# Patient Record
Sex: Female | Born: 1950 | Race: White | Hispanic: No | Marital: Married | Smoking: Never smoker
Health system: Southern US, Community
[De-identification: ages and names within clinical notes are randomized; demographics above are authoritative.]

## PROBLEM LIST (undated history)

## (undated) DIAGNOSIS — R011 Cardiac murmur, unspecified: Secondary | ICD-10-CM

## (undated) DIAGNOSIS — K219 Gastro-esophageal reflux disease without esophagitis: Secondary | ICD-10-CM

## (undated) DIAGNOSIS — I10 Essential (primary) hypertension: Secondary | ICD-10-CM

## (undated) DIAGNOSIS — F419 Anxiety disorder, unspecified: Secondary | ICD-10-CM

## (undated) HISTORY — PX: COLONOSCOPY: SHX174

## (undated) HISTORY — PX: ABDOMINAL HYSTERECTOMY: SHX81

---

## 2013-02-18 ENCOUNTER — Ambulatory Visit: Payer: Self-pay | Admitting: Internal Medicine

## 2013-02-26 ENCOUNTER — Ambulatory Visit: Payer: Self-pay | Admitting: Internal Medicine

## 2015-04-28 IMAGING — US ABDOMEN ULTRASOUND LIMITED
1 series · 14 of 25 positions shown · non-contrast
Comparison: none

REASON FOR EXAM: RUQ     RUQ pain
COMMENTS:

PROCEDURE:     US  - US ABDOMEN LIMITED SURVEY  - February 26, 2013 [DATE]
RESULT:     Comparison: None
TECHNIQUE: Multiple gray-scale and color-flow Doppler images of the right
upper quadrant are presented for review.

[Series 1: abdomen ultrasound limited · 0.20mm/px · 14 of 79 slices shown]
[im 1/79]
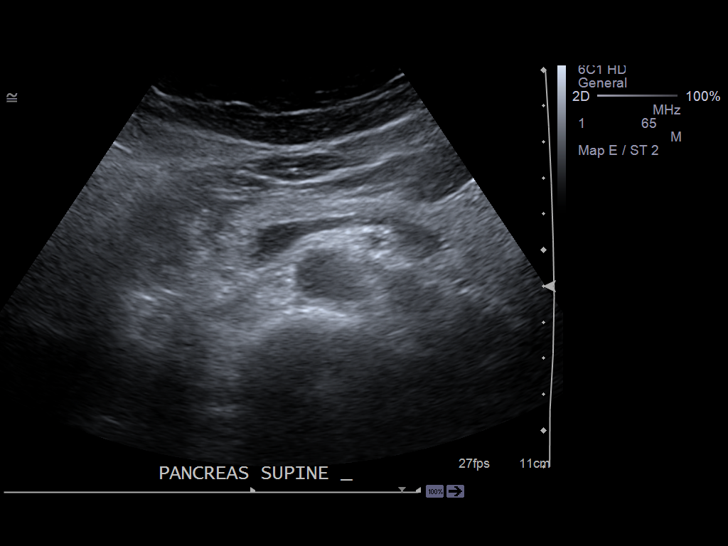
[im 7/79]
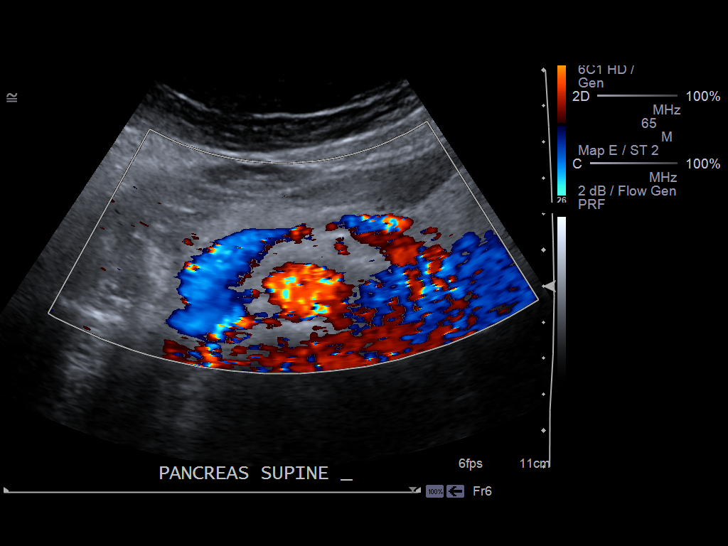
[im 14/79]
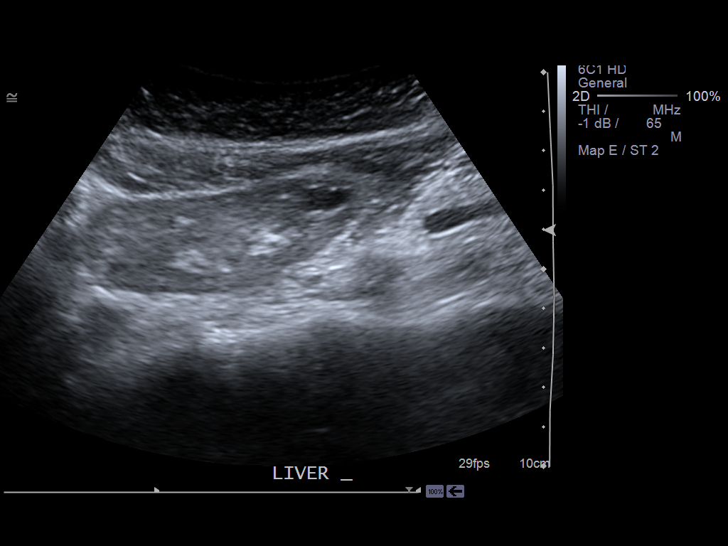
[im 20/79]
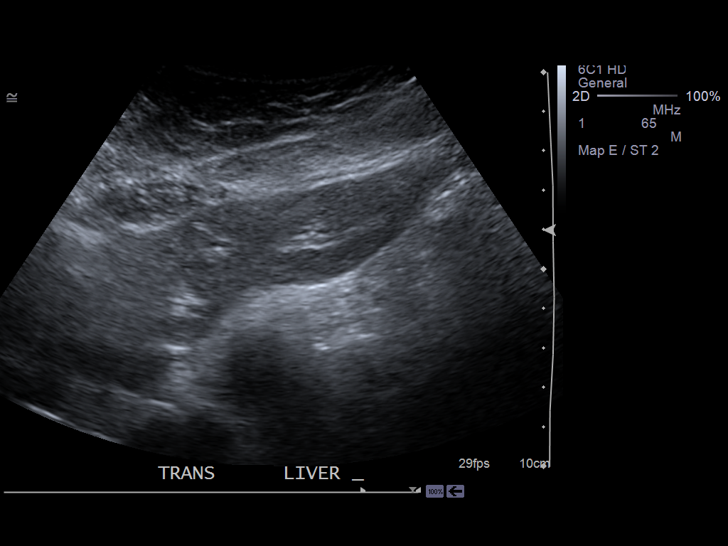
[im 27/79]
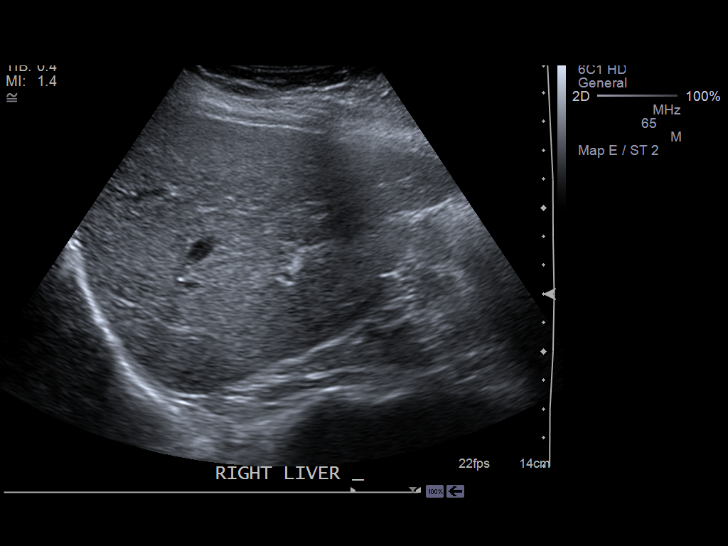
[im 30/79]
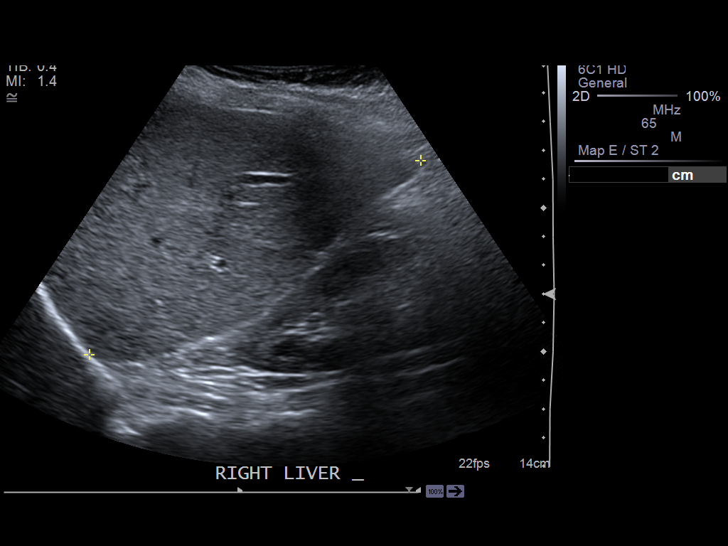
[im 36/79]
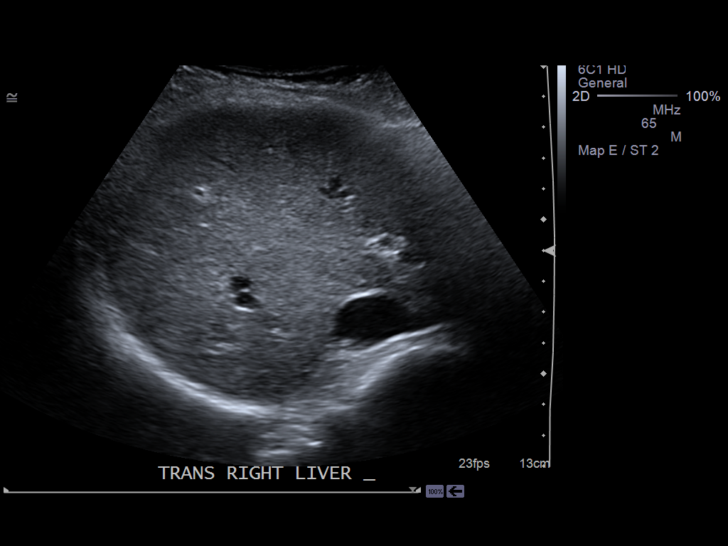
[im 43/79]
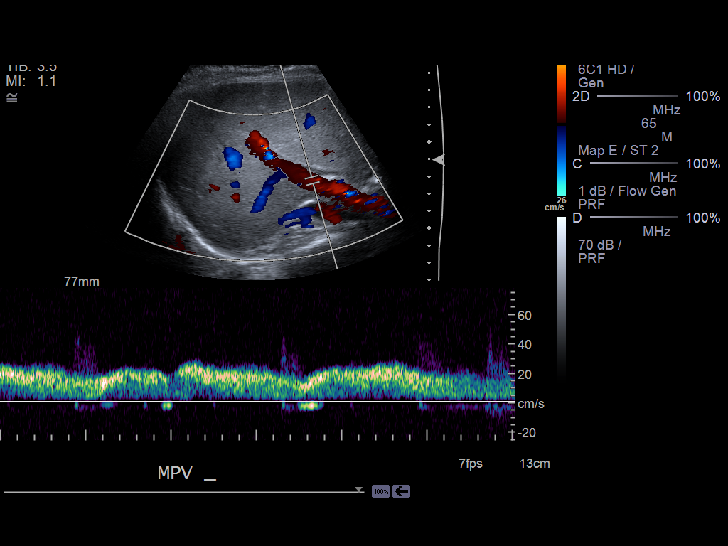
[im 49/79]
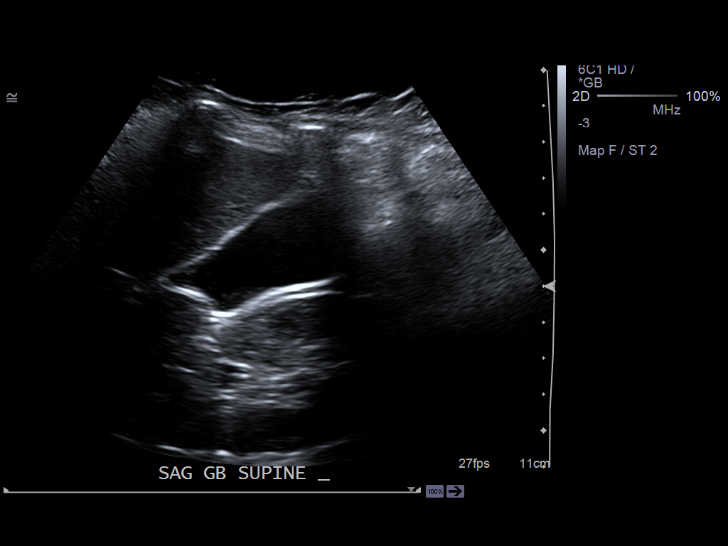
[im 53/79]
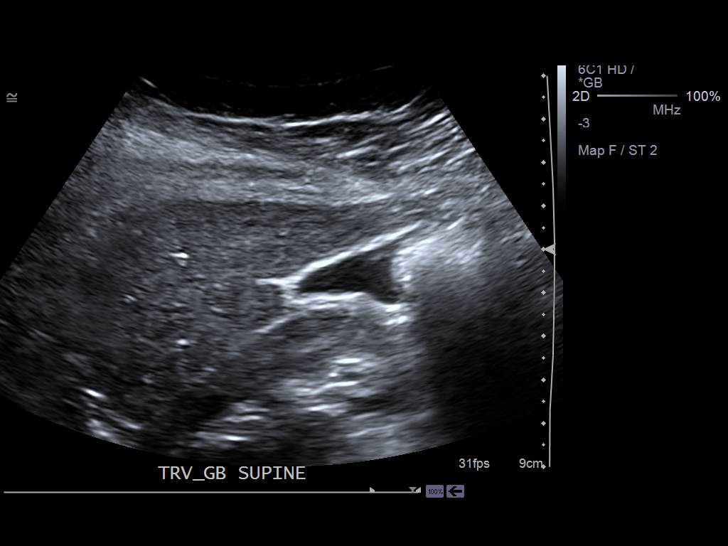
[im 59/79]
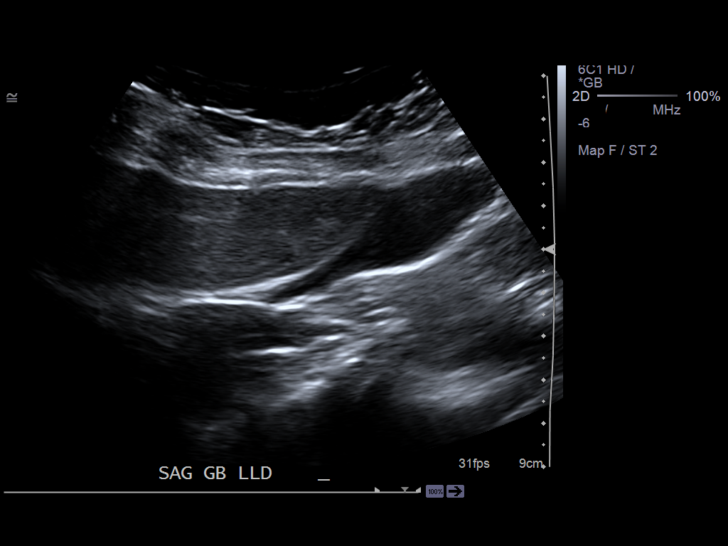
[im 66/79]
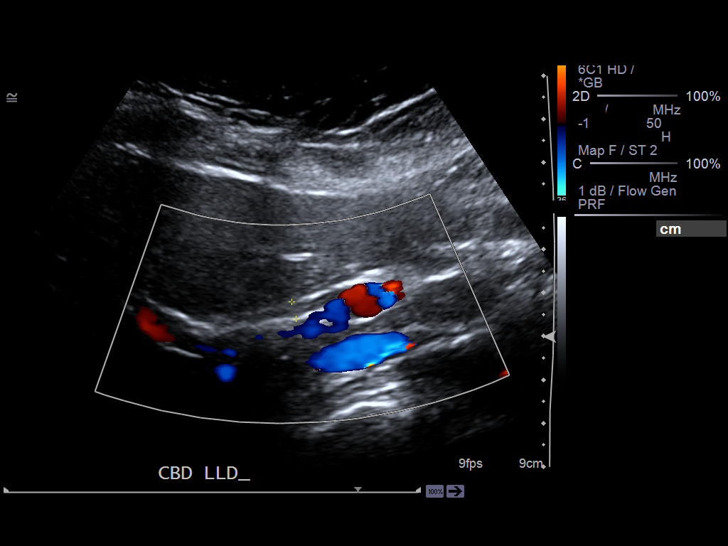
[im 72/79]
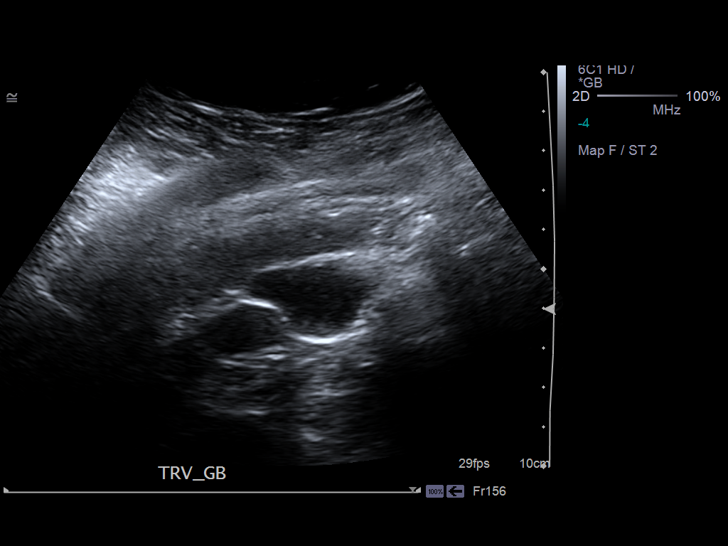
[im 79/79]
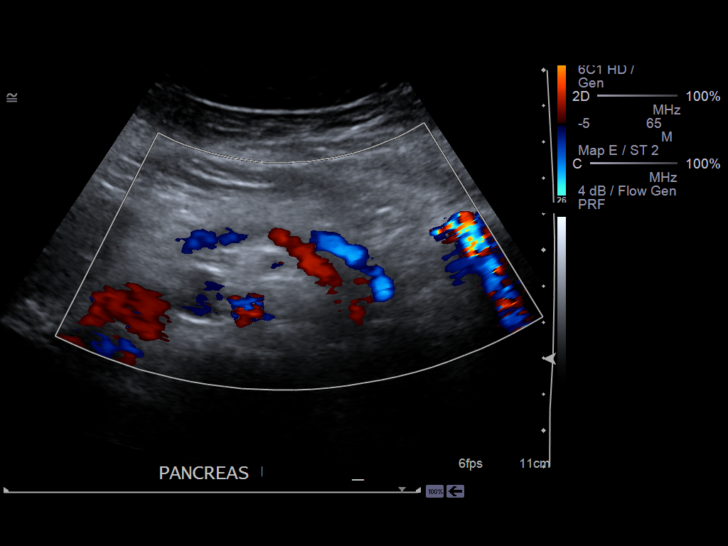

[14 of 25 positions shown; findings below may reference images not displayed]

FINDINGS: Visualized portions of the liver demonstrate normal echogenicity and normal
contours.There is a small hypoechoic area in the left lobe of the liver
measuring 1.2 x 0.8 x 0.5 cm.

There is no cholelithiasis or biliary sludge. There is no intra- or
extrahepatic biliary ductal dilatation. The common duct measures 4.5 mm in
maximal diameter. There is no gallbladder wall thickening, pericholecystic
fluid, or sonographic Murphy's sign.

The base is limited in evaluation secondary to overlying bowel gas.
IMPRESSION: No cholelithiasis or sonographic evidence of acute cholecystitis.

Indeterminate small hypoechoic nodule in the left hepatic lobe. Recommend
further evaluation with a multiphasic MRI of the abdomen.

[REDACTED]

## 2015-08-10 DIAGNOSIS — N390 Urinary tract infection, site not specified: Secondary | ICD-10-CM | POA: Diagnosis not present

## 2015-08-10 DIAGNOSIS — R399 Unspecified symptoms and signs involving the genitourinary system: Secondary | ICD-10-CM | POA: Diagnosis not present

## 2015-09-03 DIAGNOSIS — N814 Uterovaginal prolapse, unspecified: Secondary | ICD-10-CM | POA: Diagnosis not present

## 2015-09-03 DIAGNOSIS — Z1239 Encounter for other screening for malignant neoplasm of breast: Secondary | ICD-10-CM | POA: Diagnosis not present

## 2015-09-07 NOTE — H&P (Signed)
GYN H&P  CC: uterine prolapse and bladder discomfort   Subjective:    Barbara Dunlap is a 65 y.o. female who presents for a new patient office visit. She was previously seen by Dr Davis Gourd. She has known uterine prolapse, but it was not bothering her until recently. For the past 6-7 months, she has noticed more discomfort and more of a bulge in her vagina. She has not been able to have sex because of the discomfort. She has also noticed more bladder discomfort, episodes where she gets the urge to go to the bathroom but cannot go and then urges where she leaks prior to making it to the bathroom. This occurs 1x every other week. No SUI. No fecal incontinence.   She was recently treated for a UTI which has helped the bladder discomfort some, but not the urges.   She wants to pursue surgical intervention for her prolapse.   She also endorses one day in October of vaginal spotting after sitting for a long period of time. Nothing since then.  No n/v/fevers/chills/pelvic pain/bloating/early satiety  +constipation, has bowel movement 2-3x/week. Does not take supplements. +strain, no splinting, has small hemorrhoid noted on last colonoscopy.   Gynecologic History No LMP recorded. Patient is postmenopausal. Sexually active: yes but not recently due to the discomfort  Last Pap: per records 2001, but patient believes it was within the last 5 years.  Results were: normal - no h/o abnormal paps Last mammogram: 2001. Results were: normal - BIRADS 2; patient also believes she has had a mammogram more recently which was normal   Obstetric History                      OB History  Gravida Para Term Preterm AB SAB TAB Ectopic Multiple Living  2 2 2       2     # Outcome Date GA Lbr Len/2nd Weight Sex Delivery Anes PTL Lv  2 Term           1 Term               Past Medical History:  has a past medical history of Anxiety, unspecified. Problem List:  does not  have a problem list on file. Past Surgical History:  has no past surgical history on file. Family History: family history includes Multiple myeloma in her mother. Social History:  reports that she has never smoked. She has never used smokeless tobacco. She reports that she does not drink alcohol or use illicit drugs. Current Medications: has a current medication list which includes the following prescription(s): miscellaneous medical supply, multivitamin, and omega-3 fatty acids/fish oil. Prior to encounter Medications:        Current Outpatient Prescriptions on File Prior to Visit  Medication Sig Dispense Refill  . miscellaneous medical supply Misc Use as directed in the mouth or throat. Ring Stop    . multivitamin tablet Take 1 tablet by mouth once daily.    Marland Kitchen omega-3 fatty acids/fish oil 340-1,000 mg capsule Take 1 capsule by mouth 2 (two) times daily.     No current facility-administered medications on file prior to visit.    Allergies: is allergic to penicillin.  Review of Systems 14 systems reviewed pertinent positives and negatives as noted in the HPI and below.   Objective:       Vitals:   09/03/15 0946  BP: 147/86  Pulse: 97   General appearance: alert, appears stated age and cooperative PULM:  CTAB CV: RRR Head: Normocephalic, without obvious abnormality, atraumatic Breasts: normal appearance, no masses or tenderness, No nipple retraction or dimpling, No nipple discharge or bleeding, No axillary or supraclavicular adenopathy, Normal to palpation without dominant masses Abdomen: soft, non-tender; bowel sounds normal; no masses, no organomegaly Pelvic: cervix normal in appearance, external genitalia normal, no adnexal masses or tenderness, no bladder tenderness, no cervical motion tenderness, urethra without abnormality or discharge, uterus normal size, shape, and consistency, vagina normal without discharge and COMPLETE UTERINE PROLAPSE - GRADE 4 Baden  Walker Extremities: extremities normal, atraumatic, no cyanosis or edema Skin: Skin color, texture, turgor normal. No rashes or lesions Lymph nodes: Inguinal adenopathy: none    Assessment:    65yo G2P2 with symptomatic complete uterine prolapse, desiring definitive surgical therapy with TVH.   Plan:    1. Grade 4 Baden Walker prolapse/complete uterine prolapse - Extensive counseling done on r/b/a of TVH - as patient would like to resume sexual activity without discomfort, she is electing for surgery over pessary; risks of surgery reviewed including bleeding, pain, infection, possible bladder or bowel injury, possible need for transfusion and possible need for laparotomy - will defer endometrial biopsy as one day of spotting likely due to mucosal irritation - will defer pap as no h/o abnl pap  - Counseling done on risks of prolapse recurrence and possible SUI after correction and management strategies  - counseled on benefit of removing ovaries and tubes at time of surgery given her age - pt decided to proceed with BSO after signing initial consents, will add to consent on day of surgery - consent forms signed, all questions answered - book for TVH, BSO, cystoscopy - plan for pyridium 225m PO in preop area for visualization during cystoscopy   JJoylene Igo MD

## 2015-09-08 ENCOUNTER — Other Ambulatory Visit: Payer: Self-pay

## 2015-09-08 ENCOUNTER — Encounter: Payer: Self-pay | Admitting: *Deleted

## 2015-09-08 NOTE — Patient Instructions (Signed)
  Your procedure is scheduled on: 09-17-15 (FRIDAY) Report to [facility] SAME [facility] 2ND FLOOR To find out your arrival time please call [phone] between 1PM - 3PM on 09-16-15 (THURSDAY)  Remember: Instructions that are not followed completely may result in serious medical risk, up to and including death, or upon the discretion of your surgeon and anesthesiologist your surgery may need to be rescheduled.    _X___ 1. Do not eat food or drink liquids after midnight. No gum chewing or hard candies.     _X___ 2. No Alcohol for 24 hours before or after surgery.   ____ 3. Bring all medications with you on the day of surgery if instructed.    _X___ 4. Notify your doctor if there is any change in your medical condition     (cold, fever, infections).     Do not wear jewelry, make-up, hairpins, clips or nail polish.  Do not wear lotions, powders, or perfumes. You may wear deodorant.  Do not shave 48 hours prior to surgery. Men may shave face and neck.  Do not bring valuables to the hospital.    [facility] is not responsible for any belongings or valuables.               Contacts, dentures or bridgework may not be worn into surgery.  Leave your suitcase in the car. After surgery it may be brought to your room.  For patients admitted to the hospital, discharge time is determined by your treatment team.   Patients discharged the day of surgery will not be allowed to drive home.   Please read over the following fact sheets that you were given:     _X___ Take these medicines the morning of surgery with A SIP OF WATER:    1. OMEPRAZOLE  2. TAKE AN OMEPRAZOLE ON Thursday NIGHT BEFORE BED  3.   4.  5.  6.  ____ Fleet Enema (as directed)   ____ Use CHG Soap as directed  ____ Use inhalers on the day of surgery  ____ Stop metformin 2 days prior to surgery    ____ Take 1/2 of usual insulin dose the night before surgery and none on the morning of surgery.   ____ Stop  Coumadin/Plavix/aspirin-N/A  ____ Stop Anti-inflammatories-NO NSAIDS OR ASPIRIN PRODUCTS-TYLENOL OK TO TAKE   _X___ Stop supplements until after surgery-STOP FISH OIL 7 DAYS PRIOR TO SURGERY   ____ Bring C-Pap to the hospital.

## 2015-09-09 ENCOUNTER — Encounter
Admission: RE | Admit: 2015-09-09 | Discharge: 2015-09-09 | Disposition: A | Discharge status: Home / Self Care | Payer: PPO | Source: Ambulatory Visit | Attending: Obstetrics and Gynecology | Admitting: Obstetrics and Gynecology

## 2015-09-09 DIAGNOSIS — Z01812 Encounter for preprocedural laboratory examination: Secondary | ICD-10-CM | POA: Diagnosis not present

## 2015-09-09 DIAGNOSIS — Z01810 Encounter for preprocedural cardiovascular examination: Secondary | ICD-10-CM | POA: Insufficient documentation

## 2015-09-09 LAB — CBC
HEMATOCRIT: 36.4 % (ref 35.0–47.0)
HEMOGLOBIN: 12.3 g/dL (ref 12.0–16.0)
MCH: 30.9 pg (ref 26.0–34.0)
MCHC: 33.8 g/dL (ref 32.0–36.0)
MCV: 91.6 fL (ref 80.0–100.0)
Platelets: 218 10*3/uL (ref 150–440)
RBC: 3.97 MIL/uL (ref 3.80–5.20)
RDW: 13.6 % (ref 11.5–14.5)
WBC: 4.9 10*3/uL (ref 3.6–11.0)

## 2015-09-09 LAB — TYPE AND SCREEN
ABO/RH(D): O NEG
Antibody Screen: NEGATIVE

## 2015-09-09 LAB — COMPREHENSIVE METABOLIC PANEL
ALBUMIN: 4.3 g/dL (ref 3.5–5.0)
ALK PHOS: 72 U/L (ref 38–126)
ALT: 23 U/L (ref 14–54)
AST: 29 U/L (ref 15–41)
Anion gap: 7 (ref 5–15)
BILIRUBIN TOTAL: 0.7 mg/dL (ref 0.3–1.2)
BUN: 15 mg/dL (ref 6–20)
CO2: 26 mmol/L (ref 22–32)
Calcium: 9.4 mg/dL (ref 8.9–10.3)
Chloride: 99 mmol/L — ABNORMAL LOW (ref 101–111)
Creatinine, Ser: 0.91 mg/dL (ref 0.44–1.00)
GFR calc Af Amer: >60 mL/min (ref >60)
GFR calc non Af Amer: >60 mL/min (ref >60)
GLUCOSE: 101 mg/dL — AB (ref 65–99)
POTASSIUM: 3.5 mmol/L (ref 3.5–5.1)
Sodium: 132 mmol/L — ABNORMAL LOW (ref 135–145)
TOTAL PROTEIN: 7.9 g/dL (ref 6.5–8.1)

## 2015-09-09 LAB — ABO/RH: ABO/RH(D): O NEG

## 2015-09-17 ENCOUNTER — Ambulatory Visit: Payer: PPO | Admitting: Anesthesiology

## 2015-09-17 ENCOUNTER — Encounter
Admission: RE | Disposition: A | Discharge status: Home / Self Care | Payer: Self-pay | Source: Ambulatory Visit | Attending: Obstetrics and Gynecology

## 2015-09-17 ENCOUNTER — Observation Stay
Admission: RE | Admit: 2015-09-17 | Discharge: 2015-09-18 | Disposition: A | Discharge status: Home / Self Care | Payer: PPO | Source: Ambulatory Visit | Attending: Obstetrics and Gynecology | Admitting: Obstetrics and Gynecology

## 2015-09-17 ENCOUNTER — Encounter: Payer: Self-pay | Admitting: *Deleted

## 2015-09-17 DIAGNOSIS — Z807 Family history of other malignant neoplasms of lymphoid, hematopoietic and related tissues: Secondary | ICD-10-CM | POA: Diagnosis not present

## 2015-09-17 DIAGNOSIS — K219 Gastro-esophageal reflux disease without esophagitis: Secondary | ICD-10-CM | POA: Diagnosis not present

## 2015-09-17 DIAGNOSIS — Z87440 Personal history of urinary (tract) infections: Secondary | ICD-10-CM | POA: Insufficient documentation

## 2015-09-17 DIAGNOSIS — Z90710 Acquired absence of both cervix and uterus: Secondary | ICD-10-CM | POA: Diagnosis present

## 2015-09-17 DIAGNOSIS — N888 Other specified noninflammatory disorders of cervix uteri: Secondary | ICD-10-CM | POA: Diagnosis not present

## 2015-09-17 DIAGNOSIS — N8189 Other female genital prolapse: Secondary | ICD-10-CM | POA: Diagnosis not present

## 2015-09-17 DIAGNOSIS — N814 Uterovaginal prolapse, unspecified: Secondary | ICD-10-CM | POA: Diagnosis not present

## 2015-09-17 DIAGNOSIS — N736 Female pelvic peritoneal adhesions (postinfective): Secondary | ICD-10-CM | POA: Diagnosis not present

## 2015-09-17 DIAGNOSIS — Z880 Allergy status to penicillin: Secondary | ICD-10-CM | POA: Diagnosis not present

## 2015-09-17 DIAGNOSIS — N813 Complete uterovaginal prolapse: Secondary | ICD-10-CM | POA: Diagnosis not present

## 2015-09-17 HISTORY — DX: Cardiac murmur, unspecified: R01.1

## 2015-09-17 HISTORY — PX: CYSTOSCOPY: SHX5120

## 2015-09-17 HISTORY — PX: VAGINAL HYSTERECTOMY: SHX2639

## 2015-09-17 HISTORY — DX: Anxiety disorder, unspecified: F41.9

## 2015-09-17 HISTORY — DX: Gastro-esophageal reflux disease without esophagitis: K21.9

## 2015-09-17 HISTORY — PX: ANTERIOR AND POSTERIOR REPAIR: SHX5121

## 2015-09-17 SURGERY — HYSTERECTOMY, VAGINAL
Anesthesia: General | Site: Vagina

## 2015-09-17 MED ORDER — PHENAZOPYRIDINE HCL 200 MG PO TABS
200.0000 mg | ORAL_TABLET | Freq: Once | ORAL | Status: AC
  Administered 2015-09-17: 200 mg via ORAL
  Filled 2015-09-17: qty 1

## 2015-09-17 MED ORDER — ACETAMINOPHEN 650 MG RE SUPP: 650.0000 mg | Freq: Four times a day (QID) | RECTAL | Status: DC | PRN

## 2015-09-17 MED ORDER — VASOPRESSIN 20 UNIT/ML IV SOLN
INTRAVENOUS | Status: DC | PRN
  Administered 2015-09-17: 7 mL via INTRAMUSCULAR

## 2015-09-17 MED ORDER — LIDOCAINE-EPINEPHRINE 1 %-1:100000 IJ SOLN
INTRAMUSCULAR | Status: AC
  Filled 2015-09-17: qty 1

## 2015-09-17 MED ORDER — FENTANYL CITRATE (PF) 100 MCG/2ML IJ SOLN
INTRAMUSCULAR | Status: AC
  Administered 2015-09-17: 50 ug via INTRAVENOUS
  Filled 2015-09-17: qty 2

## 2015-09-17 MED ORDER — OXYCODONE HCL 5 MG PO TABS
5.0000 mg | ORAL_TABLET | ORAL | Status: DC | PRN
  Administered 2015-09-17 – 2015-09-18 (×3): 5 mg via ORAL
  Filled 2015-09-17 (×2): qty 1
  Filled 2015-09-17: qty 2

## 2015-09-17 MED ORDER — ONDANSETRON 4 MG PO TBDP
4.0000 mg | ORAL_TABLET | Freq: Four times a day (QID) | ORAL | Status: DC | PRN
  Filled 2015-09-17: qty 1

## 2015-09-17 MED ORDER — EPHEDRINE SULFATE 50 MG/ML IJ SOLN
INTRAMUSCULAR | Status: DC | PRN
  Administered 2015-09-17: 10 mg via INTRAVENOUS
  Administered 2015-09-17: 5 mg via INTRAVENOUS

## 2015-09-17 MED ORDER — BACITRACIN ZINC 500 UNIT/GM EX OINT
TOPICAL_OINTMENT | CUTANEOUS | Status: AC
  Filled 2015-09-17: qty 56.7

## 2015-09-17 MED ORDER — DEXTROSE-NACL 5-0.45 % IV SOLN
INTRAVENOUS | Status: DC
  Administered 2015-09-17 – 2015-09-18 (×3): via INTRAVENOUS

## 2015-09-17 MED ORDER — SENNOSIDES-DOCUSATE SODIUM 8.6-50 MG PO TABS
1.0000 | ORAL_TABLET | Freq: Every evening | ORAL | Status: DC | PRN
  Administered 2015-09-17: 1 via ORAL
  Filled 2015-09-17: qty 1

## 2015-09-17 MED ORDER — DEXAMETHASONE SODIUM PHOSPHATE 10 MG/ML IJ SOLN
INTRAMUSCULAR | Status: DC | PRN
  Administered 2015-09-17: 10 mg via INTRAVENOUS

## 2015-09-17 MED ORDER — FENTANYL CITRATE (PF) 100 MCG/2ML IJ SOLN
INTRAMUSCULAR | Status: DC | PRN
  Administered 2015-09-17: 50 ug via INTRAVENOUS
  Administered 2015-09-17: 150 ug via INTRAVENOUS
  Administered 2015-09-17: 50 ug via INTRAVENOUS

## 2015-09-17 MED ORDER — DIPHENHYDRAMINE HCL 50 MG/ML IJ SOLN: 12.5000 mg | Freq: Four times a day (QID) | INTRAMUSCULAR | Status: DC | PRN

## 2015-09-17 MED ORDER — MIDAZOLAM HCL 2 MG/2ML IJ SOLN
INTRAMUSCULAR | Status: DC | PRN
  Administered 2015-09-17: 1 mg via INTRAVENOUS

## 2015-09-17 MED ORDER — PROPOFOL 10 MG/ML IV BOLUS
INTRAVENOUS | Status: DC | PRN
  Administered 2015-09-17: 120 mg via INTRAVENOUS

## 2015-09-17 MED ORDER — FENTANYL CITRATE (PF) 100 MCG/2ML IJ SOLN
25.0000 ug | INTRAMUSCULAR | Status: DC | PRN
  Administered 2015-09-17 (×3): 50 ug via INTRAVENOUS

## 2015-09-17 MED ORDER — DIPHENHYDRAMINE HCL 12.5 MG/5ML PO ELIX
12.5000 mg | ORAL_SOLUTION | Freq: Four times a day (QID) | ORAL | Status: DC | PRN
  Filled 2015-09-17: qty 5

## 2015-09-17 MED ORDER — BACITRACIN ZINC 500 UNIT/GM EX OINT
TOPICAL_OINTMENT | CUTANEOUS | Status: DC | PRN
  Administered 2015-09-17 (×2): 1 via TOPICAL

## 2015-09-17 MED ORDER — VASOPRESSIN 20 UNIT/ML IV SOLN
INTRAVENOUS | Status: AC
  Filled 2015-09-17: qty 1

## 2015-09-17 MED ORDER — ONDANSETRON HCL 4 MG/2ML IJ SOLN
INTRAMUSCULAR | Status: DC | PRN
  Administered 2015-09-17: 4 mg via INTRAVENOUS

## 2015-09-17 MED ORDER — ACETAMINOPHEN 325 MG PO TABS: 650.0000 mg | ORAL_TABLET | Freq: Four times a day (QID) | ORAL | Status: DC | PRN

## 2015-09-17 MED ORDER — KETOROLAC TROMETHAMINE 15 MG/ML IJ SOLN
15.0000 mg | Freq: Four times a day (QID) | INTRAMUSCULAR | Status: AC
  Administered 2015-09-17: 15 mg via INTRAVENOUS
  Filled 2015-09-17: qty 1

## 2015-09-17 MED ORDER — CLINDAMYCIN PHOSPHATE 600 MG/50ML IV SOLN
600.0000 mg | Freq: Once | INTRAVENOUS | Status: AC
  Administered 2015-09-17: 600 mg via INTRAVENOUS

## 2015-09-17 MED ORDER — ONDANSETRON HCL 4 MG/2ML IJ SOLN: 4.0000 mg | Freq: Four times a day (QID) | INTRAMUSCULAR | Status: DC | PRN

## 2015-09-17 MED ORDER — SODIUM CHLORIDE 0.9 % IJ SOLN
INTRAMUSCULAR | Status: AC
  Filled 2015-09-17: qty 100

## 2015-09-17 MED ORDER — LIDOCAINE HCL (CARDIAC) 20 MG/ML IV SOLN
INTRAVENOUS | Status: DC | PRN
  Administered 2015-09-17: 10 mg via INTRAVENOUS

## 2015-09-17 MED ORDER — KETOROLAC TROMETHAMINE 30 MG/ML IJ SOLN
INTRAMUSCULAR | Status: DC | PRN
  Administered 2015-09-17: 30 mg via INTRAVENOUS

## 2015-09-17 MED ORDER — LACTATED RINGERS IV SOLN
INTRAVENOUS | Status: DC
  Administered 2015-09-17 (×2): via INTRAVENOUS

## 2015-09-17 MED ORDER — PROMETHAZINE HCL 25 MG/ML IJ SOLN: 6.2500 mg | INTRAMUSCULAR | Status: DC | PRN

## 2015-09-17 MED ORDER — ROCURONIUM BROMIDE 100 MG/10ML IV SOLN
INTRAVENOUS | Status: DC | PRN
  Administered 2015-09-17: 30 mg via INTRAVENOUS

## 2015-09-17 MED ORDER — GENTAMICIN SULFATE 40 MG/ML IJ SOLN
1.5000 mg/kg | Freq: Once | INTRAMUSCULAR | Status: AC
  Administered 2015-09-17: 90 mg via INTRAVENOUS
  Filled 2015-09-17: qty 2.25

## 2015-09-17 MED ORDER — SUGAMMADEX SODIUM 500 MG/5ML IV SOLN
INTRAVENOUS | Status: DC | PRN
  Administered 2015-09-17: 119.8 mg via INTRAVENOUS

## 2015-09-17 MED ORDER — CLINDAMYCIN PHOSPHATE 600 MG/50ML IV SOLN
INTRAVENOUS | Status: AC
Start: 2015-09-17 — End: 2015-09-17
  Administered 2015-09-17: 600 mg via INTRAVENOUS
  Filled 2015-09-17: qty 50

## 2015-09-17 MED ORDER — KETOROLAC TROMETHAMINE 15 MG/ML IJ SOLN
15.0000 mg | Freq: Four times a day (QID) | INTRAMUSCULAR | Status: DC | PRN
  Administered 2015-09-18 (×3): 15 mg via INTRAVENOUS
  Filled 2015-09-17 (×3): qty 1

## 2015-09-17 SURGICAL SUPPLY — 42 items
BAG URO DRAIN 2000ML W/SPOUT (MISCELLANEOUS) ×4 IMPLANT
CANISTER SUCT 1200ML W/VALVE (MISCELLANEOUS) ×4 IMPLANT
CATH FOLEY 2WAY  5CC 16FR (CATHETERS) ×1
CATH ROBINSON RED A/P 16FR (CATHETERS) ×4 IMPLANT
CATH URTH 16FR FL 2W BLN LF (CATHETERS) ×3 IMPLANT
CNTNR SPEC 2.5X3XGRAD LEK (MISCELLANEOUS) ×3
CONT SPEC 4OZ STER OR WHT (MISCELLANEOUS) ×1
CONTAINER SPEC 2.5X3XGRAD LEK (MISCELLANEOUS) ×3 IMPLANT
DRAPE PERI LITHO V/GYN (MISCELLANEOUS) ×4 IMPLANT
DRAPE SURG 17X11 SM STRL (DRAPES) ×4 IMPLANT
DRAPE UNDER BUTTOCK W/FLU (DRAPES) ×4 IMPLANT
ELECT REM PT RETURN 9FT ADLT (ELECTROSURGICAL) ×4
ELECTRODE REM PT RTRN 9FT ADLT (ELECTROSURGICAL) ×3 IMPLANT
GAUZE PACK 2X3YD (MISCELLANEOUS) ×4 IMPLANT
GLOVE BIO SURGEON STRL SZ 6.5 (GLOVE) ×4 IMPLANT
GLOVE BIO SURGEON STRL SZ7 (GLOVE) ×4 IMPLANT
GLOVE BIO SURGEON STRL SZ8 (GLOVE) ×8 IMPLANT
GLOVE BIOGEL PI IND STRL 6.5 (GLOVE) ×3 IMPLANT
GLOVE BIOGEL PI IND STRL 7.0 (GLOVE) ×3 IMPLANT
GLOVE BIOGEL PI INDICATOR 6.5 (GLOVE) ×1
GLOVE BIOGEL PI INDICATOR 7.0 (GLOVE) ×1
GOWN STRL REUS W/ TWL LRG LVL3 (GOWN DISPOSABLE) ×9 IMPLANT
GOWN STRL REUS W/ TWL XL LVL3 (GOWN DISPOSABLE) ×3 IMPLANT
GOWN STRL REUS W/TWL LRG LVL3 (GOWN DISPOSABLE) ×3
GOWN STRL REUS W/TWL XL LVL3 (GOWN DISPOSABLE) ×1
KIT RM TURNOVER CYSTO AR (KITS) ×4 IMPLANT
LABEL OR SOLS (LABEL) ×4 IMPLANT
NDL SAFETY 22GX1.5 (NEEDLE) ×4 IMPLANT
NEEDLE HYPO 22GX1.5 SAFETY (NEEDLE) ×4 IMPLANT
PACK BASIN MINOR ARMC (MISCELLANEOUS) ×4 IMPLANT
PAD OB MATERNITY 4.3X12.25 (PERSONAL CARE ITEMS) ×4 IMPLANT
PAD PREP 24X41 OB/GYN DISP (PERSONAL CARE ITEMS) ×4 IMPLANT
SET CYSTO W/LG BORE CLAMP LF (SET/KITS/TRAYS/PACK) ×4 IMPLANT
SUT PDS 2-0 27IN (SUTURE) ×4 IMPLANT
SUT VIC AB 0 CT1 27 (SUTURE) ×4
SUT VIC AB 0 CT1 27XCR 8 STRN (SUTURE) ×12 IMPLANT
SUT VIC AB 0 CT1 36 (SUTURE) ×8 IMPLANT
SUT VIC AB 2-0 SH 27 (SUTURE) ×3
SUT VIC AB 2-0 SH 27XBRD (SUTURE) ×9 IMPLANT
SYR CONTROL 10ML (SYRINGE) ×4 IMPLANT
SYRINGE 10CC LL (SYRINGE) ×8 IMPLANT
WATER STERILE IRR 1000ML POUR (IV SOLUTION) ×4 IMPLANT

## 2015-09-17 NOTE — Anesthesia Postprocedure Evaluation (Signed)
Anesthesia Post Note  Patient: Barbara Dunlap  Procedure(s) Performed: Procedure(s) (LRB): HYSTERECTOMY VAGINAL (N/A) ANTERIOR REPAIR (CYSTOCELE)  (N/A) CYSTOSCOPY (N/A)  Patient location during evaluation: PACU Anesthesia Type: General Level of consciousness: awake and alert Pain management: pain level controlled Vital Signs Assessment: post-procedure vital signs reviewed and stable Respiratory status: spontaneous breathing, nonlabored ventilation, respiratory function stable and patient connected to nasal cannula oxygen Cardiovascular status: blood pressure returned to baseline and stable Postop Assessment: no signs of nausea or vomiting Anesthetic complications: no    Last Vitals:  Filed Vitals:   09/17/15 1451 09/17/15 1517  BP: 121/72 120/63  Pulse: 73 78  Temp: 36.5 C   Resp: 14     Last Pain:  Filed Vitals:   09/17/15 1517  PainSc: 4                  Lenard Simmer

## 2015-09-17 NOTE — Anesthesia Procedure Notes (Signed)
Procedure Name: Intubation Date/Time: 09/17/2015 11:18 AM Performed by: Omer Jack Pre-anesthesia Checklist: Patient identified, Patient being monitored, Timeout performed, Emergency Drugs available and Suction available Patient Re-evaluated:Patient Re-evaluated prior to inductionOxygen Delivery Method: Circle system utilized Preoxygenation: Pre-oxygenation with 100% oxygen Intubation Type: IV induction Ventilation: Mask ventilation without difficulty Laryngoscope Size: Miller and 2 Grade View: Grade I Tube type: Oral Tube size: 7.0 mm Number of attempts: 1 Placement Confirmation: ETT inserted through vocal cords under direct vision,  positive ETCO2 and breath sounds checked- equal and bilateral Secured at: 21 cm Tube secured with: Tape Dental Injury: Teeth and Oropharynx as per pre-operative assessment

## 2015-09-17 NOTE — OR Nursing (Signed)
Variance from original schedule. Dr. Tildon Husky has requessted consent for TVH, BSO, cystoscopy and possible anterior repair. This was discussed with patient and she is in full agreement.

## 2015-09-17 NOTE — Op Note (Signed)
Barbara Dunlap PROCEDURE DATE: 09/17/2015  PREOPERATIVE DIAGNOSIS:   Pelvic organ prolapse   POSTOPERATIVE DIAGNOSIS:   Pelvic organ prolapse  SURGEON:   Ala Dach, M.D. ASSISTANT: Suzy Bouchard, M.D. OPERATION:  Total Vaginal Hysterectomy, apical suspension, anterior repair, cystoscopy  ANESTHESIA:  General endotracheal.  INDICATIONS: The patient is a 65 y.o. G2P2 w/ symptomatic POP desiring surgical intervention. On the preoperative visit, the risks, benefits, indications, and alternatives of the procedure were reviewed with the patient.  On the day of surgery, the risks of surgery were again discussed with the patient including but not limited to: bleeding which may require transfusion or reoperation; infection which may require antibiotics; injury to bowel, bladder, ureters or other surrounding organs; need for additional procedures; thromboembolic phenomenon, incisional problems and other postoperative/anesthesia complications. Written informed consent was obtained.    OPERATIVE FINDINGS: A 5cm size uterus with normal tubes and ovaries bilaterally. Complete uterine prolapse. Redundant bladder tissue on cystoscopy.    ESTIMATED BLOOD LOSS: 50 ml FLUIDS:  800 ml of Lactated Ringers URINE OUTPUT:  700 ml of clear yellow urine. SPECIMENS:  Uterus and cervix sent to pathology COMPLICATIONS:  None immediate.  DESCRIPTION OF PROCEDURE:  The patient received prophylactic intravenous antibiotics with gentamycin and clindamycin, received  PO Pyridium, and had sequential compression devices applied to her lower extremities while in the preoperative area.    She was taken to the operating room, where she was identified by name and birth date. General anesthesia was administered and was found to be adequate.  She was placed in the dorsal lithotomy position using yellow fins, and was prepped and draped in a sterile manner.  A formal time out procedure was performed with all team  members present and in agreement. A straight catheter was inserted into her bladder and drained for 550cc urine. A urine specimen was sent for urine culture for a TOC after treatment for UTI. Attention was turned to her pelvis.   A weighted speculum was placed in the vagina, and the anterior and posterior lips of the cervix were grasped bilaterally with thyroid tenaculums. The cervix was circumferentially incised using #10 blade. The vesicouterine peritoneum was not readily identified anterior or posterior. Progressive bites were taken of paracervical tissue with the Heaney clamp and then cut and suture ligated until the vesicouterine peritoneum could be identified. The vesicouterine peritoneum was identified in  posterior cul-de-sac was entered sharply in the midline. A long weighted speculum was inserted into the posterior cul-de-sac. The anterior cul-de-sac was then entered sharply without difficulty and the bladder retracted out of the operative field behind a retractor. Neither the uterosacral or the cardinal ligaments could be readily identified during the dissection. The uterine vessels and broad ligaments were then serially clamped with the Heaney clamps, cut, and suture ligated on both sides.  Excellent hemostasis was noted.    The uterus was then delivered via the posterior cul-de-sac, and the cornua were clamped with the Heaney clamps, transected, and the uterine specimen was delivered and sent to pathology. These pedicles were then suture ligated to ensure hemostasis. Hemostasis was achieved with figure of eight sutures of 0-Vicryl.   The Fallopian tubes were then identified high in the pelvis and not readily visualzied. The ovaries were palpated and found to be normal.   Once hemostasis was achieved, a 1-Vicryl was used for the culdoplasty. The uterosacral ligaments were not able to be identified likely due to long standing prolapse. A high apical stitch was placed  in the posterior peritoneum  with good reapproximation of the posterior apical structures and decent support of the vaginal wall. Due to her pelvic anatomy, better support was unable to be achieved without pursuing a sacrospinous ligament suspension, for which she was not consented. The peritoneum of the cuff was then purse stringed with 1-Vicryl from left to right.   Attention was then focused on the anterior repair. An Allis clamp was placed under the urethra and a second Allis was placed at the superior aspect of the vaginal cuff repair to serve as the apex. 5cc of dilute vasopressin was injected into the vaginal mucosa hydrodissecting the plane. A #15 blade was used to make a midline vaginal wall incision. Bilateral flaps of the perivaginal fascia and perivesical fascia were created. The bladder was then plicated in a left to right fashion using 0-Vicryl interrupted sutures. The vaginal wall was trimmed and then sutured with 2-0 Vicryl in a continuous manner. This also incorporated the vaginal mucosa of the cuff.    Cystoscopy was then performed with a 70 degree scope which confirmed bilateral ureteral jets and intact bladder. The bladder appeared to have redundant tissue.   A pack was placed after a Foley catheter was inserted for postoperative care.  The patient tolerated the procedure well.  All instruments, needles, and sponge counts were correct x 2. The patient was taken to the recovery room in stable condition.    Ala Dach, MD

## 2015-09-17 NOTE — Anesthesia Preprocedure Evaluation (Signed)
Anesthesia Evaluation  Patient identified by MRN, date of birth, ID band Patient awake    Reviewed: Allergy & Precautions, H&P , NPO status , Patient's Chart, lab work & pertinent test results, reviewed documented beta blocker date and time   History of Anesthesia Complications Negative for: history of anesthetic complications  Airway Mallampati: I  TM Distance: >3 FB Neck ROM: full    Dental no notable dental hx. (+) Caps, Chipped, Missing   Pulmonary neg pulmonary ROS,    Pulmonary exam normal breath sounds clear to auscultation       Cardiovascular Exercise Tolerance: Good (-) hypertension(-) angina(-) CAD, (-) Past MI, (-) Cardiac Stents and (-) CABG Normal cardiovascular exam(-) dysrhythmias + Valvular Problems/Murmurs (had an echo that was not worrisome)  Rhythm:regular Rate:Normal     Neuro/Psych PSYCHIATRIC DISORDERS (Anxiety) negative neurological ROS     GI/Hepatic Neg liver ROS, GERD  Medicated and Controlled,  Endo/Other  negative endocrine ROS  Renal/GU negative Renal ROS  negative genitourinary   Musculoskeletal   Abdominal   Peds  Hematology negative hematology ROS (+)   Anesthesia Other Findings Past Medical History:   Heart murmur                                                 Anxiety                                                      GERD (gastroesophageal reflux disease)                       Reproductive/Obstetrics negative OB ROS                             Anesthesia Physical Anesthesia Plan  ASA: II  Anesthesia Plan: General   Post-op Pain Management:    Induction:   Airway Management Planned:   Additional Equipment:   Intra-op Plan:   Post-operative Plan:   Informed Consent: I have reviewed the patients History and Physical, chart, labs and discussed the procedure including the risks, benefits and alternatives for the proposed anesthesia with the  patient or authorized representative who has indicated his/her understanding and acceptance.   Dental Advisory Given  Plan Discussed with: Anesthesiologist, CRNA and Surgeon  Anesthesia Plan Comments:         Anesthesia Quick Evaluation

## 2015-09-17 NOTE — Transfer of Care (Signed)
Immediate Anesthesia Transfer of Care Note  Patient: Barbara Dunlap  Procedure(s) Performed: Procedure(s): HYSTERECTOMY VAGINAL (N/A) ANTERIOR REPAIR (CYSTOCELE)  (N/A) CYSTOSCOPY (N/A)  Patient Location: PACU  Anesthesia Type:General  Level of Consciousness: patient cooperative and lethargic  Airway & Oxygen Therapy: Patient Spontanous Breathing and Patient connected to face mask oxygen  Post-op Assessment: Report given to RN and Post -op Vital signs reviewed and stable  Post vital signs: Reviewed and stable  Last Vitals:  Filed Vitals:   09/17/15 0841 09/17/15 1335  BP: 149/88 97/60  Pulse: 88 67  Temp: 36 C 36.7 C  Resp: 16 15    Complications: No apparent anesthesia complications

## 2015-09-18 DIAGNOSIS — N814 Uterovaginal prolapse, unspecified: Secondary | ICD-10-CM | POA: Diagnosis not present

## 2015-09-18 MED ORDER — OXYCODONE HCL 5 MG PO TABS: 5.0000 mg | ORAL_TABLET | ORAL | Status: DC | PRN

## 2015-09-18 MED ORDER — POVIDONE-IODINE 10 % EX SWAB: 1.0000 "application " | CUTANEOUS | Status: DC | PRN

## 2015-09-18 MED ORDER — DOCUSATE SODIUM 50 MG PO CAPS: 50.0000 mg | ORAL_CAPSULE | Freq: Every day | ORAL | Status: DC

## 2015-09-18 MED ORDER — CATHETER NELATION STRAIGHT TIP MISC: Status: DC

## 2015-09-18 NOTE — Care Management Obs Status (Signed)
MEDICARE OBSERVATION STATUS NOTIFICATION   Patient Details  Name: Barbara Dunlap MRN: [id number] Date of Birth: [birth year]   Medicare Observation Status Notification Given:  No (surgical patient / discharged prior to letter delivered)    Caren Macadam, RN 09/18/2015, 5:28 PM

## 2015-09-18 NOTE — Progress Notes (Signed)
POD#1 Surgery: TVH, Anterior Repair, Cystoscopy  Overnight events: none  S: Pain well controlled with roxi, tolerating PO, no n/v, not yet OOB  O: Filed Vitals:   09/18/15 0414 09/18/15 0717  BP: 106/52 97/47  Pulse: 74 82  Temp: 98.4 F (36.9 C) 98.5 F (36.9 C)  Resp: 18 16     UOP: 750cc/10 hrs = 75cc/hr  GEN: NAD ABD: soft, NT GU: vaginal pack removed, no bright red blood, vulva normal EXT: wwp   A/P: 64yo G2P2 POD#1 s/p TVH, Anterior Repair, Cystoscopy for POP. Doing well. Extensive discussion had with patient regarding the operative findings (redundant bladder and concern for overflow incontinence, lack of apical support). We discussed the benefit of straight catheterization at home intermittently as needed given her inability to empty her bladder completely. Patient in agreement with plan  1. Pain - cont roxi  - rx for home  2. GU - monitor bleeding, precautions given - TOV today - will straight cath after 2nd void and measure - straight cath teaching - please send home with toilet hat to record UOP   Anticipate D/C home today after TOV  Ala Dach, MD

## 2015-09-18 NOTE — Discharge Instructions (Signed)
Vaginal Hysterectomy, Care After Refer to this sheet in the next few weeks. These instructions provide you with information on caring for yourself after your procedure. Your health care provider may also give you more specific instructions. Your treatment has been planned according to current medical practices, but problems sometimes occur. Call your health care provider if you have any problems or questions after your procedure. WHAT TO EXPECT AFTER THE PROCEDURE After your procedure, it is typical to have the following:  Abdominal pain. You will be given pain medicine to control it.  Sore throat from the breathing tube that was inserted during surgery. HOME CARE INSTRUCTIONS  Only take over-the-counter or prescription medicines for pain, discomfort, or fever as directed by your health care provider.  Do not take aspirin. It can cause bleeding.  Do not drive when taking pain medicine.  Follow your health care provider's advice regarding diet, exercise, lifting, driving, and general activities.  Resume your usual diet as directed and allowed.  Get plenty of rest and sleep.  Do not douche, use tampons, or have sexual intercourse for at least 6 weeks, or until your health care provider gives you permission.  Monitor your temperature and notify your health care provider of a fever.  Take showers instead of baths for 6 weeks.  Do not drink alcohol until your health care provider gives you permission.  If you develop constipation, you may take a mild laxative with your health care provider's permission. Bran foods may help with constipation problems. Drinking enough fluids to keep your urine clear or pale yellow may help as well.  Try to have someone home with you for 1-2 weeks to help around the house.  Keep all of your follow-up appointments as directed by your health care provider. SEEK MEDICAL CARE IF:  1. You have swelling, redness, or increasing pain around your incision  sites. 2. You have pus coming from your incision. 3. You notice a bad smell coming from your incision. 4. Your incision breaks open. 5. You feel dizzy or lightheaded. 6. You have pain or bleeding when you urinate. 7. You have persistent diarrhea. 8. You have persistent nausea and vomiting. 9. You have abnormal vaginal discharge. 10. You have a rash. 11. You have any type of abnormal reaction or develop an allergy to your medicine. 12. You have poor pain control with your prescribed medicine. SEEK IMMEDIATE MEDICAL CARE IF:   You have a fever.  You have severe abdominal pain.  You have chest pain.  You have shortness of breath.  You faint.  You have pain, swelling, or redness in your leg.  You have heavy vaginal bleeding with blood clots. MAKE SURE YOU:  Understand these instructions.  Will watch your condition.  Will get help right away if you are not doing well or get worse.   This information is not intended to replace advice given to you by your health care provider. Make sure you discuss any questions you have with your health care provider.   Document Released: 07/13/2011 Document Revised: 07/29/2013 Document Reviewed: 02/06/2013 Elsevier Interactive Patient Education 2016 [employer]  Bladder diary: DAILY Record when you void (date/time) and how much Record episodes of leakage or urgency

## 2015-09-18 NOTE — Progress Notes (Signed)
Discharge instructions complete and prescriptions given. Patient verbalizes understanding of teaching. Patient discharged home at 1730.

## 2015-09-18 NOTE — Discharge Summary (Signed)
Physician Discharge Summary  Patient ID: KEARSTIN LEARN MRN: [id number] DOB/AGE: [birth year] 65 y.o.  Admit date: 09/17/2015 Discharge date: 09/18/2015  Admission Diagnoses: POP, incontinence  Discharge Diagnoses:  Active Problems:   S/P vaginal hysterectomy   Discharged Condition: good  Hospital Course: Patient underwent and uncomplicated TVH and anterior repair. She did well overnight on PO pain meds. She tolerated PO and was able to ambulate without difficulty. She voided 400cc and then 600cc with 15cc urine on straight cath. She was discharged home on strict pelvic rest on POD#1.  Consults: None  Significant Diagnostic Studies: none  Discharge Exam: Blood pressure 114/57, pulse 75, temperature 98.2 F (36.8 C), temperature source Oral, resp. rate 18, height  (1.702 m), weight 59.875 kg (132 lb), SpO2 100 %. General appearance: alert, cooperative and no distress Pelvic: external genitalia normal and vagina normal without discharge Extremities: extremities normal, atraumatic, no cyanosis or edema  Disposition: Final discharge disposition not confirmed HOME    Medication List    STOP taking these medications        multivitamin tablet     sulfamethoxazole-trimethoprim 800-160 MG tablet  Commonly known as:  BACTRIM DS,SEPTRA DS      TAKE these medications        docusate sodium 50 MG capsule  Commonly known as:  COLACE  Take 1 capsule (50 mg total) by mouth daily.     FISH OIL PO  Take 1 tablet by mouth daily.     omeprazole 20 MG capsule  Commonly known as:  PRILOSEC  Take 20 mg by mouth as needed.     OVER THE COUNTER MEDICATION  Take 1 tablet by mouth as needed (RING-STOP).     oxyCODONE 5 MG immediate release tablet  Commonly known as:  Oxy IR/ROXICODONE  Take 1 tablet (5 mg total) by mouth every 4 (four) hours as needed for moderate pain or severe pain.           Follow-up Information    Follow up with Ala Dach, MD On 09/23/2015.   Specialty:  Obstetrics and Gynecology   Contact information:   [address] [place] [address] [phone]       Follow up with Ala Dach, MD On 10/29/2015.   Specialty:  Obstetrics and Gynecology   Contact information:   [address] [place] [address] [phone]       Signed: Ala Dach 09/18/2015, 4:15 PM

## 2015-09-19 LAB — URINE CULTURE: CULTURE: NO GROWTH

## 2015-09-20 LAB — SURGICAL PATHOLOGY

## 2016-07-12 DIAGNOSIS — J399 Disease of upper respiratory tract, unspecified: Secondary | ICD-10-CM | POA: Diagnosis not present

## 2016-07-12 DIAGNOSIS — I059 Rheumatic mitral valve disease, unspecified: Secondary | ICD-10-CM | POA: Diagnosis not present

## 2016-07-12 DIAGNOSIS — J00 Acute nasopharyngitis [common cold]: Secondary | ICD-10-CM | POA: Diagnosis not present

## 2016-07-12 DIAGNOSIS — I10 Essential (primary) hypertension: Secondary | ICD-10-CM | POA: Diagnosis not present

## 2016-07-17 ENCOUNTER — Other Ambulatory Visit: Payer: Self-pay | Admitting: Internal Medicine

## 2016-07-17 ENCOUNTER — Ambulatory Visit
Admission: RE | Admit: 2016-07-17 | Discharge: 2016-07-17 | Disposition: A | Discharge status: Home / Self Care | Payer: PPO | Source: Ambulatory Visit | Attending: Internal Medicine | Admitting: Internal Medicine

## 2016-07-17 DIAGNOSIS — R059 Cough, unspecified: Secondary | ICD-10-CM

## 2016-07-17 DIAGNOSIS — J209 Acute bronchitis, unspecified: Secondary | ICD-10-CM | POA: Diagnosis not present

## 2016-07-17 DIAGNOSIS — R531 Weakness: Secondary | ICD-10-CM | POA: Insufficient documentation

## 2016-07-17 DIAGNOSIS — J00 Acute nasopharyngitis [common cold]: Secondary | ICD-10-CM | POA: Diagnosis not present

## 2016-07-17 DIAGNOSIS — R05 Cough: Secondary | ICD-10-CM | POA: Diagnosis not present

## 2016-07-17 DIAGNOSIS — I059 Rheumatic mitral valve disease, unspecified: Secondary | ICD-10-CM | POA: Diagnosis not present

## 2016-07-17 DIAGNOSIS — I10 Essential (primary) hypertension: Secondary | ICD-10-CM | POA: Diagnosis not present

## 2016-07-18 DIAGNOSIS — J00 Acute nasopharyngitis [common cold]: Secondary | ICD-10-CM | POA: Diagnosis not present

## 2016-07-18 DIAGNOSIS — I059 Rheumatic mitral valve disease, unspecified: Secondary | ICD-10-CM | POA: Diagnosis not present

## 2016-07-18 DIAGNOSIS — J399 Disease of upper respiratory tract, unspecified: Secondary | ICD-10-CM | POA: Diagnosis not present

## 2016-07-18 DIAGNOSIS — I10 Essential (primary) hypertension: Secondary | ICD-10-CM | POA: Diagnosis not present

## 2017-01-18 ENCOUNTER — Ambulatory Visit (INDEPENDENT_AMBULATORY_CARE_PROVIDER_SITE_OTHER): Payer: PPO | Admitting: Obstetrics & Gynecology

## 2017-01-18 ENCOUNTER — Encounter: Payer: Self-pay | Admitting: Obstetrics & Gynecology

## 2017-01-18 VITALS — BP 149/87 | HR 97 | Ht 67.0 in | Wt 129.0 lb

## 2017-01-18 DIAGNOSIS — N8110 Cystocele, unspecified: Secondary | ICD-10-CM

## 2017-01-18 NOTE — Progress Notes (Signed)
   Subjective:    Patient ID: Barbara Dunlap, female    DOB: January 25, 1951, 66 y.o.   MRN: [id number]  HPI 66 yo MW P2 (8640 and 67 yo kids, 1 granddaughter in [place]) here today with the issue of possible vaginal prolapse. She had TVH, anterior repair, apical fixation, and cystoscopy at [facility] last year. The prolapse repeat started around the end of April. She is not sexually active for years.   Review of Systems She denies any incontinence.    Objective:   Physical Exam WNWHWFNAD Breathing, conversing, and ambulating normally Her vagina is completely prolapsed I fitted her with a #3 ring with support. This seemed to relieve her symptoms and not cause any discomfort.       Assessment & Plan:  Complete vaginal prolpase- I discussed pessary versus referral to Dr. Lavella Hammockatherine Matthews at [facility] for surgical repair. At this point she would like to try the pessary. We will order the appropriate pessary and call her when it has arrived for a pessary visit.

## 2017-01-29 ENCOUNTER — Ambulatory Visit: Payer: PPO | Admitting: Obstetrics & Gynecology

## 2017-02-08 ENCOUNTER — Encounter: Payer: Self-pay | Admitting: Obstetrics & Gynecology

## 2017-02-08 ENCOUNTER — Ambulatory Visit (INDEPENDENT_AMBULATORY_CARE_PROVIDER_SITE_OTHER): Payer: PPO | Admitting: Obstetrics & Gynecology

## 2017-02-08 VITALS — BP 146/75 | HR 91 | Wt 129.0 lb

## 2017-02-08 DIAGNOSIS — N8110 Cystocele, unspecified: Secondary | ICD-10-CM | POA: Diagnosis not present

## 2017-02-08 NOTE — Progress Notes (Signed)
   Subjective:    Patient ID: Barbara Dunlap, female    DOB: 1951/06/22, 66 y.o.   MRN: [id number]  HPI  66 yo lady here for her pessary.   Review of Systems     Objective:   Physical Exam WNWHWFNAD Breathing, conversing, and ambulating normally #3 ring with support was placed. It relieved her prolapse and caused no pain. She was able to remove and replace it.       Assessment & Plan:  Prolapse- rec removal of pessary at least weekly RTC 4 weeks/prn sooner

## 2017-02-09 ENCOUNTER — Telehealth: Payer: Self-pay | Admitting: *Deleted

## 2017-02-09 NOTE — Telephone Encounter (Signed)
Pt was seen in the office yesterday for pessary fitting, states she is now experiencing some dark brown vaginal discharge like old blood.  Informed pt that her vaginal tissue is very thin at this point and due to taking the pessary in and out it may have caused a slight abrasion to the vaginal wall.  Instructed to continue using the pessary and to call back if bleeding turns bright red and increases or she starts to have increase pain.  Pt acknowledged instructions.

## 2017-02-23 DIAGNOSIS — R5381 Other malaise: Secondary | ICD-10-CM | POA: Diagnosis not present

## 2017-02-23 DIAGNOSIS — I10 Essential (primary) hypertension: Secondary | ICD-10-CM | POA: Diagnosis not present

## 2017-02-23 DIAGNOSIS — E784 Other hyperlipidemia: Secondary | ICD-10-CM | POA: Diagnosis not present

## 2017-03-23 ENCOUNTER — Ambulatory Visit: Payer: PPO | Admitting: Obstetrics & Gynecology

## 2017-03-27 ENCOUNTER — Encounter: Payer: Self-pay | Admitting: Obstetrics & Gynecology

## 2017-03-27 ENCOUNTER — Ambulatory Visit: Payer: PPO | Admitting: Obstetrics & Gynecology

## 2017-03-27 ENCOUNTER — Ambulatory Visit (INDEPENDENT_AMBULATORY_CARE_PROVIDER_SITE_OTHER): Payer: PPO | Admitting: Obstetrics & Gynecology

## 2017-03-27 VITALS — Wt 126.0 lb

## 2017-03-27 DIAGNOSIS — N8110 Cystocele, unspecified: Secondary | ICD-10-CM | POA: Diagnosis not present

## 2017-03-27 NOTE — Progress Notes (Signed)
   Subjective:    Patient ID: Barbara Dunlap, female    DOB: 1951/05/22, 66 y.o.   MRN: [id number]  HPI  66 yo white lady here for her 2 month pessary check. She is happy with it. She removes and cleans it about q 3 months.   Review of Systems     Objective:   Physical Exam  Breathing, conversing, and ambulating normally Well nourished, well hydrated white female, no apparent distress Vaginal mucosa with no excoriations    Assessment & Plan:   Complete vaginal prolapse- continue pessary Return 1 year/prn sooner

## 2017-05-03 DIAGNOSIS — H2513 Age-related nuclear cataract, bilateral: Secondary | ICD-10-CM | POA: Diagnosis not present

## 2017-05-03 DIAGNOSIS — H04129 Dry eye syndrome of unspecified lacrimal gland: Secondary | ICD-10-CM | POA: Diagnosis not present

## 2017-05-29 DIAGNOSIS — R0989 Other specified symptoms and signs involving the circulatory and respiratory systems: Secondary | ICD-10-CM | POA: Diagnosis not present

## 2017-05-29 DIAGNOSIS — I059 Rheumatic mitral valve disease, unspecified: Secondary | ICD-10-CM | POA: Diagnosis not present

## 2017-05-29 DIAGNOSIS — J399 Disease of upper respiratory tract, unspecified: Secondary | ICD-10-CM | POA: Diagnosis not present

## 2017-05-29 DIAGNOSIS — E8881 Metabolic syndrome: Secondary | ICD-10-CM | POA: Diagnosis not present

## 2017-07-08 DIAGNOSIS — N390 Urinary tract infection, site not specified: Secondary | ICD-10-CM | POA: Diagnosis not present

## 2017-07-08 DIAGNOSIS — S39012A Strain of muscle, fascia and tendon of lower back, initial encounter: Secondary | ICD-10-CM | POA: Diagnosis not present

## 2017-08-16 DIAGNOSIS — J399 Disease of upper respiratory tract, unspecified: Secondary | ICD-10-CM | POA: Diagnosis not present

## 2017-08-16 DIAGNOSIS — E8881 Metabolic syndrome: Secondary | ICD-10-CM | POA: Diagnosis not present

## 2017-08-16 DIAGNOSIS — I059 Rheumatic mitral valve disease, unspecified: Secondary | ICD-10-CM | POA: Diagnosis not present

## 2017-08-16 DIAGNOSIS — R0989 Other specified symptoms and signs involving the circulatory and respiratory systems: Secondary | ICD-10-CM | POA: Diagnosis not present

## 2017-09-18 DIAGNOSIS — R0989 Other specified symptoms and signs involving the circulatory and respiratory systems: Secondary | ICD-10-CM | POA: Diagnosis not present

## 2017-09-18 DIAGNOSIS — J399 Disease of upper respiratory tract, unspecified: Secondary | ICD-10-CM | POA: Diagnosis not present

## 2017-09-18 DIAGNOSIS — I059 Rheumatic mitral valve disease, unspecified: Secondary | ICD-10-CM | POA: Diagnosis not present

## 2017-09-18 DIAGNOSIS — E8881 Metabolic syndrome: Secondary | ICD-10-CM | POA: Diagnosis not present

## 2017-12-14 DIAGNOSIS — M25511 Pain in right shoulder: Secondary | ICD-10-CM | POA: Diagnosis not present

## 2017-12-14 DIAGNOSIS — M7581 Other shoulder lesions, right shoulder: Secondary | ICD-10-CM | POA: Diagnosis not present

## 2018-01-10 DIAGNOSIS — R29898 Other symptoms and signs involving the musculoskeletal system: Secondary | ICD-10-CM | POA: Diagnosis not present

## 2018-01-10 DIAGNOSIS — M25511 Pain in right shoulder: Secondary | ICD-10-CM | POA: Diagnosis not present

## 2018-01-17 ENCOUNTER — Encounter: Payer: Self-pay | Admitting: Radiology

## 2018-02-13 DIAGNOSIS — S29012A Strain of muscle and tendon of back wall of thorax, initial encounter: Secondary | ICD-10-CM | POA: Diagnosis not present

## 2018-02-14 ENCOUNTER — Other Ambulatory Visit: Payer: Self-pay

## 2018-02-14 ENCOUNTER — Emergency Department: Payer: Medicare HMO

## 2018-02-14 ENCOUNTER — Emergency Department
Admission: EM | Admit: 2018-02-14 | Discharge: 2018-02-14 | Disposition: A | Discharge status: Home / Self Care | Payer: Medicare HMO | Attending: Emergency Medicine | Admitting: Emergency Medicine

## 2018-02-14 DIAGNOSIS — Z79899 Other long term (current) drug therapy: Secondary | ICD-10-CM | POA: Insufficient documentation

## 2018-02-14 DIAGNOSIS — R000 Tachycardia, unspecified: Secondary | ICD-10-CM

## 2018-02-14 DIAGNOSIS — Z7982 Long term (current) use of aspirin: Secondary | ICD-10-CM | POA: Insufficient documentation

## 2018-02-14 DIAGNOSIS — I10 Essential (primary) hypertension: Secondary | ICD-10-CM | POA: Insufficient documentation

## 2018-02-14 DIAGNOSIS — J439 Emphysema, unspecified: Secondary | ICD-10-CM | POA: Diagnosis not present

## 2018-02-14 HISTORY — DX: Essential (primary) hypertension: I10

## 2018-02-14 LAB — URINALYSIS, COMPLETE (UACMP) WITH MICROSCOPIC
Bilirubin Urine: NEGATIVE
GLUCOSE, UA: NEGATIVE mg/dL
Hgb urine dipstick: NEGATIVE
KETONES UR: 5 mg/dL — AB
LEUKOCYTES UA: NEGATIVE
Nitrite: NEGATIVE
PH: 6 (ref 5.0–8.0)
Protein, ur: NEGATIVE mg/dL
Specific Gravity, Urine: 1.009 (ref 1.005–1.030)

## 2018-02-14 LAB — CBC
HCT: 38.5 % (ref 35.0–47.0)
Hemoglobin: 13.3 g/dL (ref 12.0–16.0)
MCH: 31.7 pg (ref 26.0–34.0)
MCHC: 34.5 g/dL (ref 32.0–36.0)
MCV: 91.9 fL (ref 80.0–100.0)
Platelets: 322 10*3/uL (ref 150–440)
RBC: 4.18 MIL/uL (ref 3.80–5.20)
RDW: 13.3 % (ref 11.5–14.5)
WBC: 8 10*3/uL (ref 3.6–11.0)

## 2018-02-14 LAB — BASIC METABOLIC PANEL
ANION GAP: 12 (ref 5–15)
BUN: 18 mg/dL (ref 8–23)
CALCIUM: 9.6 mg/dL (ref 8.9–10.3)
CHLORIDE: 95 mmol/L — AB (ref 98–111)
CO2: 22 mmol/L (ref 22–32)
Creatinine, Ser: 0.83 mg/dL (ref 0.44–1.00)
GFR calc non Af Amer: >60 mL/min (ref >60)
Glucose, Bld: 191 mg/dL — ABNORMAL HIGH (ref 70–99)
Potassium: 3.7 mmol/L (ref 3.5–5.1)
Sodium: 129 mmol/L — ABNORMAL LOW (ref 135–145)

## 2018-02-14 LAB — TROPONIN I

## 2018-02-14 MED ORDER — BUTALBITAL-APAP-CAFFEINE 50-325-40 MG PO TABS
1.0000 | ORAL_TABLET | Freq: Once | ORAL | Status: AC
  Administered 2018-02-14: 1 via ORAL
  Filled 2018-02-14: qty 1

## 2018-02-14 MED ORDER — SODIUM CHLORIDE 0.9 % IV BOLUS
1000.0000 mL | Freq: Once | INTRAVENOUS | Status: AC
  Administered 2018-02-14: 1000 mL via INTRAVENOUS

## 2018-02-14 NOTE — ED Triage Notes (Signed)
Pt states she takes metoprolol for HTN. Denies afib.  C/o upper back pain. Went to [facility]. Was given a shot in back for pain. Came home and states "my heart started just a beating, it went up to 130. Took a metoprolol and it came back down." came down to 59. Got up this AM and took prednisone. States since taking prednisone HR has been elevated.   Alert, oriented, ambulatory. No distress noted. HR elevated in triage.

## 2018-02-14 NOTE — Discharge Instructions (Addendum)
Please seek medical attention for any high fevers, chest pain, shortness of breath, change in behavior, persistent vomiting, bloody stool or any other new or concerning symptoms.  

## 2018-02-14 NOTE — ED Provider Notes (Signed)
[facility] Emergency Department Provider Note  ____________________________________________   I have reviewed the triage vital signs and the nursing notes.   HISTORY  Chief Complaint Tachycardia   History limited by: Not Limited   HPI [name] is a 67 y.o. female who presents to the emergency department today because of concerns for fast heart rates.  The patient states that it started yesterday.  She had seen in urgent care because of concern for poor muscle in her upper back.  She has had this pain in the past and she pulled initially back in December.  She was given a shot of pain medication which is actually helped with the pain.  Additionally however she was given prednisone.  She took 3 of the prednisone tablets and then shortly after noticed that her heart rate was starting to race.  It would go high and low.  She is on metoprolol and took a dose of metoprolol which somewhat helped control the heart rate.  She denies any chest pain or shortness of breath.   Per medical record review patient has a history of hypertension  Past Medical History:  Diagnosis Date  . Anxiety   . GERD (gastroesophageal reflux disease)   . Heart murmur   . Hypertension     Patient Active Problem List   Diagnosis Date Noted  . S/P vaginal hysterectomy 09/17/2015    Past Surgical History:  Procedure Laterality Date  . ABDOMINAL HYSTERECTOMY    . ANTERIOR AND POSTERIOR REPAIR N/A 09/17/2015   Procedure: ANTERIOR REPAIR (CYSTOCELE) ;  Surgeon: Ala DachJohanna K Halfon, MD;  Location: [facility] ORS;  Service: Gynecology;  Laterality: N/A;  . COLONOSCOPY    . CYSTOSCOPY N/A 09/17/2015   Procedure: CYSTOSCOPY;  Surgeon: Ala DachJohanna K Halfon, MD;  Location: [facility] ORS;  Service: Gynecology;  Laterality: N/A;  . VAGINAL HYSTERECTOMY N/A 09/17/2015   Procedure: HYSTERECTOMY VAGINAL;  Surgeon: Ala DachJohanna K Halfon, MD;  Location: [facility] ORS;  Service: Gynecology;  Laterality: N/A;    Prior to  Admission medications   Medication Sig Start Date End Date Taking? Authorizing Provider  aspirin EC 81 MG tablet Take 81 mg by mouth daily.    [provider]  metoprolol succinate (TOPROL-XL) 25 MG 24 hr tablet  03/12/17   [provider]  Multiple Vitamins-Minerals (MULTIVITAMIN ADULT PO) Take 1 tablet by mouth daily.    [provider]  Omega-3 Fatty Acids (FISH OIL PO) Take 1 tablet by mouth daily.    [provider]  omeprazole (PRILOSEC) 20 MG capsule Take 20 mg by mouth as needed.    [provider]  OVER THE COUNTER MEDICATION Take 1 tablet by mouth as needed (RING-STOP).     [provider]    Allergies Lactose intolerance (gi) and Penicillins  Family History  Problem Relation Age of Onset  . Cancer Brother 3570       Prostate    Social History Social History   Tobacco Use  . Smoking status: Never Smoker  . Smokeless tobacco: Never Used  Substance Use Topics  . Alcohol use: No  . Drug use: No    Review of Systems Constitutional: No fever/chills Eyes: No visual changes. ENT: No sore throat. Cardiovascular: Denies chest pain.  Positive for increased heart rate Respiratory: Denies shortness of breath. Gastrointestinal: No abdominal pain.  No nausea, no vomiting.  No diarrhea.   Genitourinary: Negative for dysuria. Musculoskeletal: Positive for left upper back pain Skin: Negative for rash.  Neurological: Negative for headaches, focal weakness or numbness.  ____________________________________________   PHYSICAL EXAM:  VITAL SIGNS: ED Triage Vitals  Enc Vitals Group     BP 02/14/18 1811 (!) 179/98     Pulse Rate 02/14/18 1811 (!) 127     Resp 02/14/18 1811 18     Temp 02/14/18 1811 99 F (37.2 C)     Temp Source 02/14/18 1811 Oral     SpO2 02/14/18 1811 97 %     Weight 02/14/18 1812 132 lb (59.9 kg)     Height 02/14/18 1812 5\' 7"  (1.702 m)     Head Circumference --      Peak Flow --      Pain Score  02/14/18 1812 0   Constitutional: Alert and oriented.  Eyes: Conjunctivae are normal.  ENT      Head: Normocephalic and atraumatic.      Nose: No congestion/rhinnorhea.      Mouth/Throat: Mucous membranes are moist.      Neck: No stridor. Hematological/Lymphatic/Immunilogical: No cervical lymphadenopathy. Cardiovascular: Normal rate, regular rhythm.  No murmurs, rubs, or gallops.  Respiratory: Normal respiratory effort without tachypnea nor retractions. Breath sounds are clear and equal bilaterally. No wheezes/rales/rhonchi. Gastrointestinal: Soft and non tender. No rebound. No guarding.  Genitourinary: Deferred Musculoskeletal: Normal range of motion in all extremities. No lower extremity edema. Neurologic:  Normal speech and language. No gross focal neurologic deficits are appreciated.  Skin:  Skin is warm, dry and intact. No rash noted. Psychiatric: Mood and affect are normal. Speech and behavior are normal. Patient exhibits appropriate insight and judgment.  ____________________________________________    LABS (pertinent positives/negatives)  CBC wbc 8.0, hgb 13.3, plt 322 Trop <0.03 BMP na 129, k 3.7, glu 191, cr 0.83 ____________________________________________   EKG  I, Phineas Semen, attending physician, personally viewed and interpreted this EKG  EKG Time: 1818 Rate: 117 Rhythm: sinus tachycardia Axis: left axis deviation Intervals: qtc 443 QRS: narrow, q waves v1 ST changes: no st elevation Impression: abnormal ekg  ____________________________________________    RADIOLOGY  CXR No acute findings  ____________________________________________   PROCEDURES  Procedures  ____________________________________________   INITIAL IMPRESSION / ASSESSMENT AND PLAN / ED COURSE  Pertinent labs & imaging results that were available during my care of the patient were reviewed by me and considered in my medical decision making (see chart for details).    Patient presented to the emergency department today because of concerns for abnormal heart rate.  This could be due to dehydration, medication side effect.  Patient's blood work did show slightly low sodium and chloride.  She was given IV fluids which did help with the heart rate and patient symptomatically felt better.  Discussed importance of primary care follow-up.   ____________________________________________   FINAL CLINICAL IMPRESSION(S) / ED DIAGNOSES  Final diagnoses:  Tachycardia     Note: This dictation was prepared with Dragon dictation. Any transcriptional errors that result from this process are unintentional     Phineas Semen, MD 02/14/18 2153

## 2018-02-19 DIAGNOSIS — R0989 Other specified symptoms and signs involving the circulatory and respiratory systems: Secondary | ICD-10-CM | POA: Diagnosis not present

## 2018-02-19 DIAGNOSIS — J399 Disease of upper respiratory tract, unspecified: Secondary | ICD-10-CM | POA: Diagnosis not present

## 2018-02-19 DIAGNOSIS — E8881 Metabolic syndrome: Secondary | ICD-10-CM | POA: Diagnosis not present

## 2018-02-19 DIAGNOSIS — I059 Rheumatic mitral valve disease, unspecified: Secondary | ICD-10-CM | POA: Diagnosis not present

## 2018-03-14 DIAGNOSIS — Z0000 Encounter for general adult medical examination without abnormal findings: Secondary | ICD-10-CM | POA: Diagnosis not present

## 2018-03-14 DIAGNOSIS — E8881 Metabolic syndrome: Secondary | ICD-10-CM | POA: Diagnosis not present

## 2018-03-14 DIAGNOSIS — I059 Rheumatic mitral valve disease, unspecified: Secondary | ICD-10-CM | POA: Diagnosis not present

## 2018-03-14 DIAGNOSIS — R0989 Other specified symptoms and signs involving the circulatory and respiratory systems: Secondary | ICD-10-CM | POA: Diagnosis not present

## 2018-09-16 IMAGING — CR DG CHEST 2V
1 series · 2 of 2 positions shown · non-contrast
Comparison: February 18, 2013

CLINICAL DATA: Cough and congestion

EXAM:
CHEST  2 VIEW

[Series 1: dg chest 2 view · 0.14mm/px · 2 of 2 slices shown]
[im 1/2]
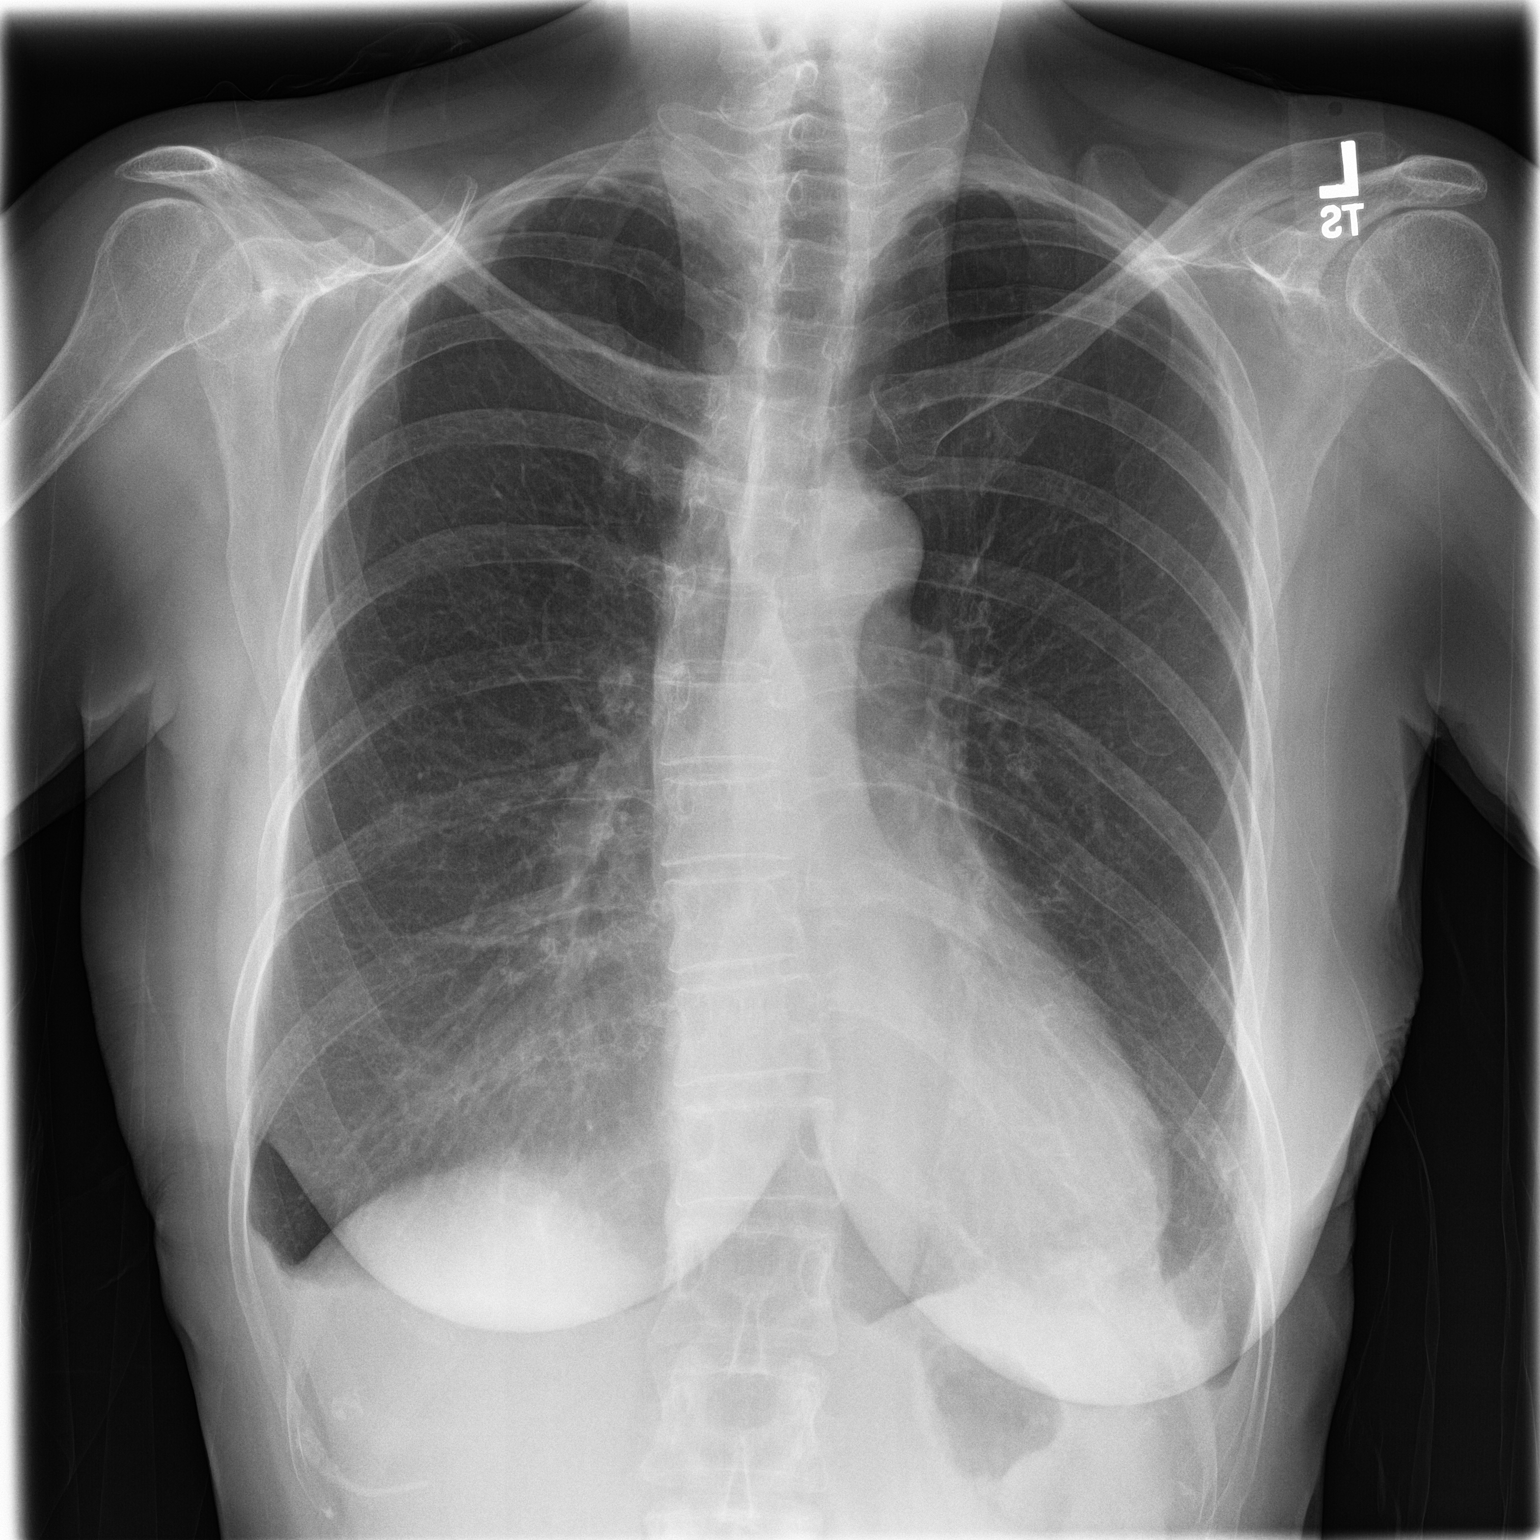
[im 2/2]
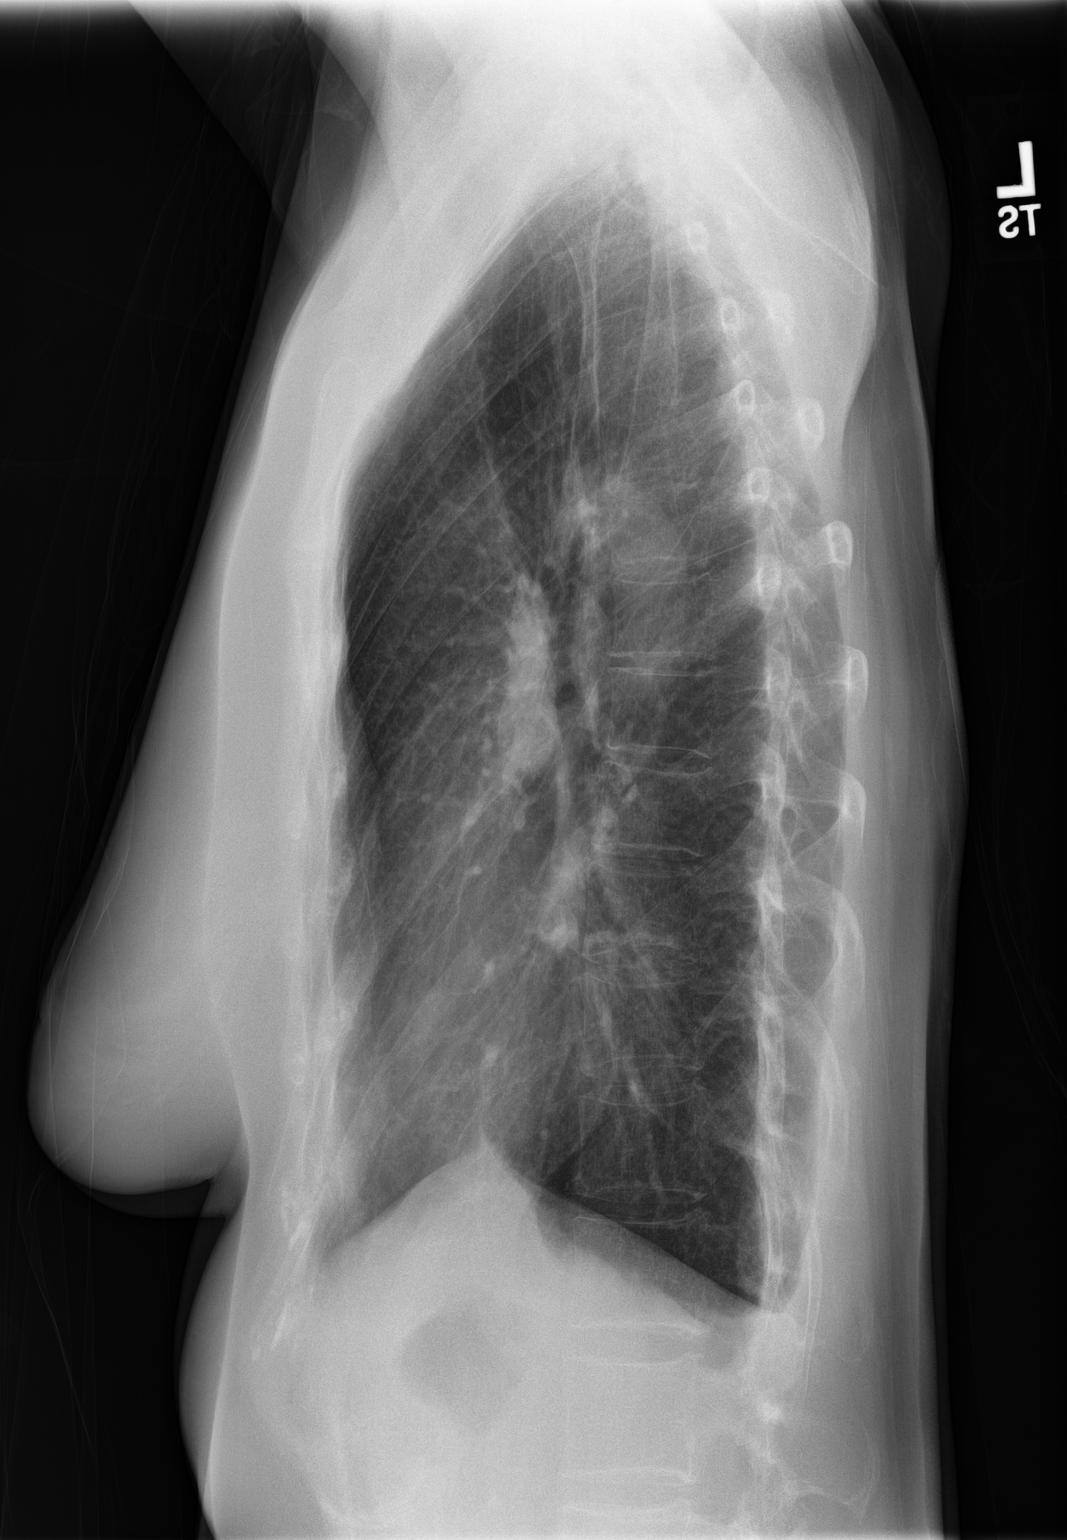

[2 of 2 positions shown; findings below may reference images not displayed]

FINDINGS: There is mild scarring in the lateral lung bases. There is no edema
or consolidation. The heart size and pulmonary vascularity are
normal. No adenopathy. No bone lesions.
IMPRESSION: Mild lateral lung base scarring.  No edema or consolidation.

## 2020-04-15 IMAGING — CR DG CHEST 2V
2 series · 2 of 2 positions shown · non-contrast
Comparison: None.

CLINICAL DATA: Tachycardia and left shoulder pain yesterday.

EXAM:
CHEST - 2 VIEW

[chest pa]
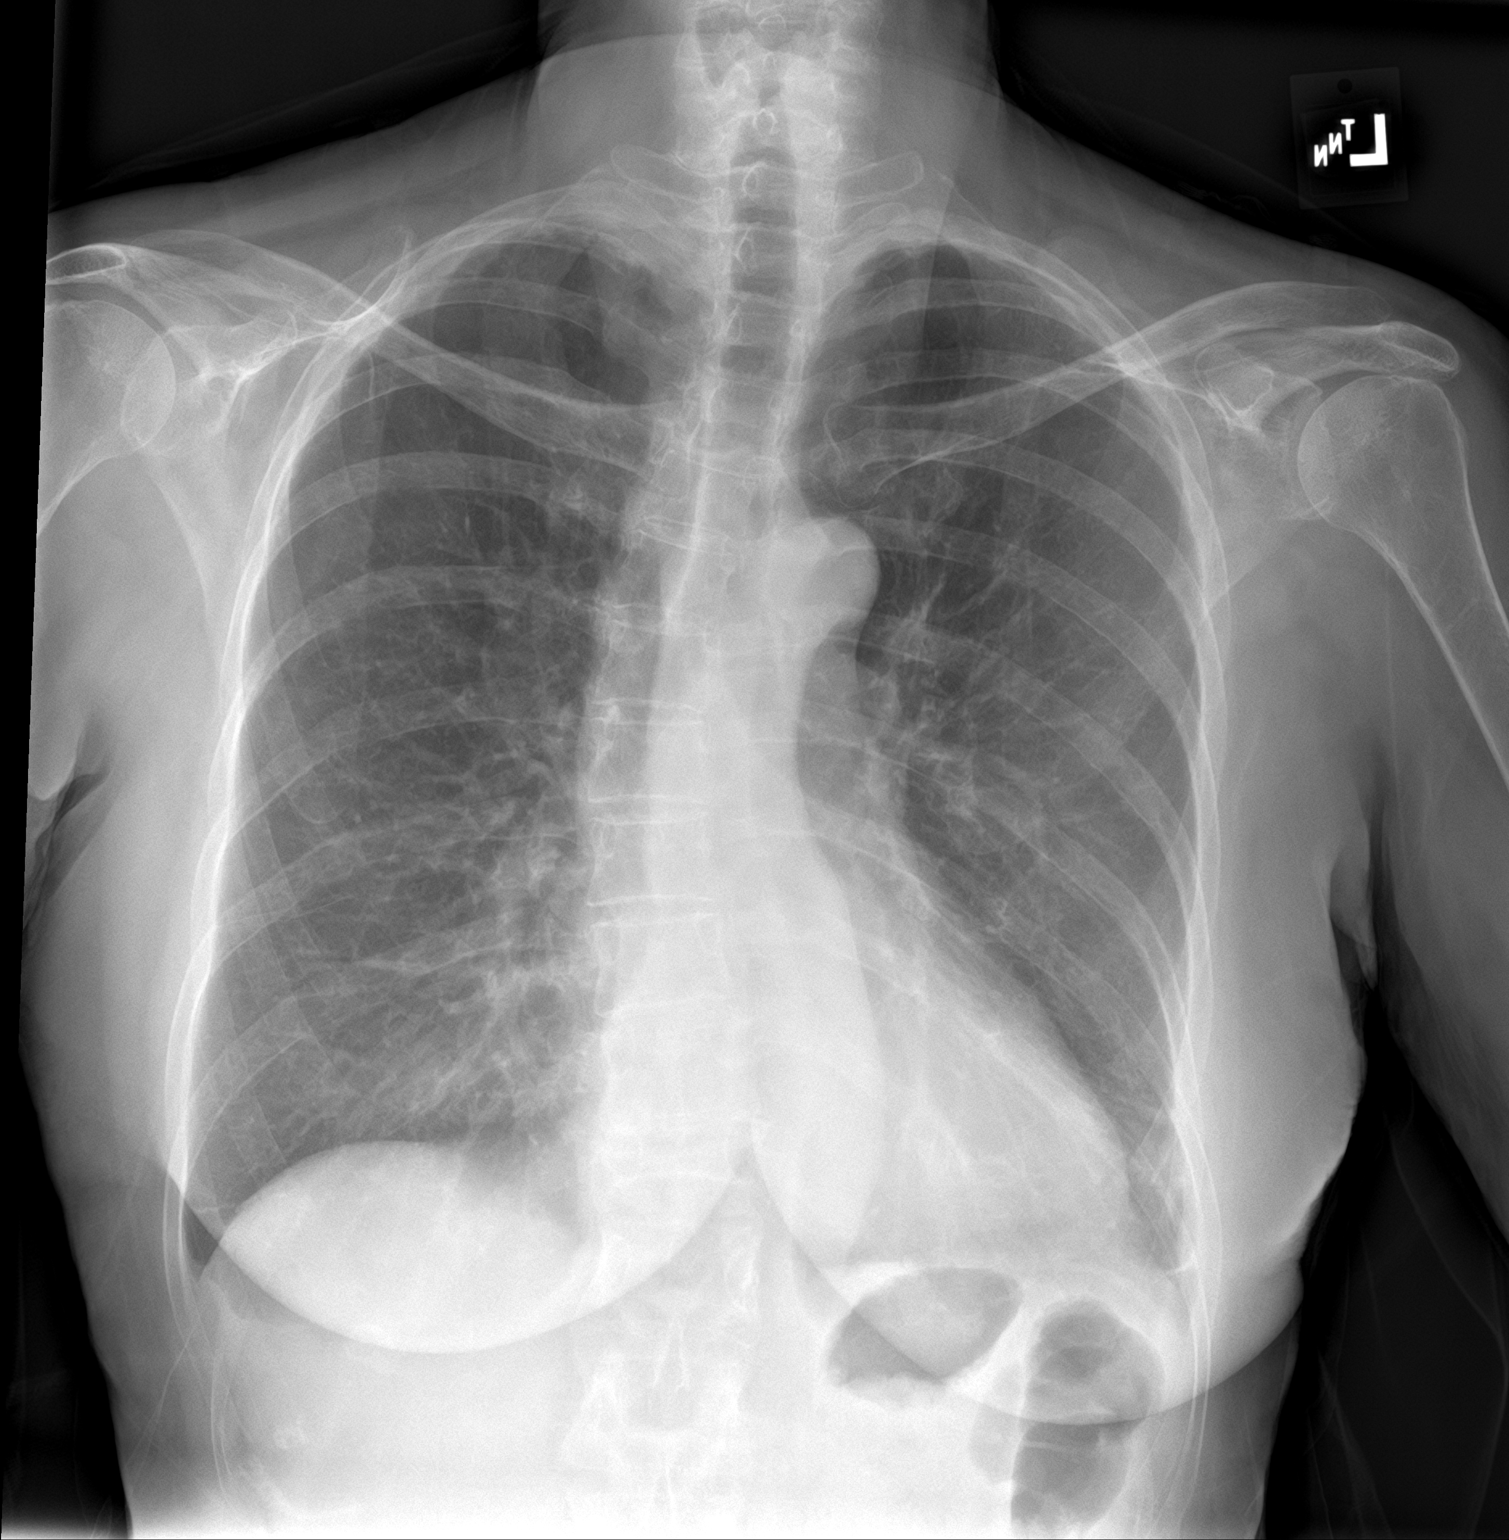

[chest lat]
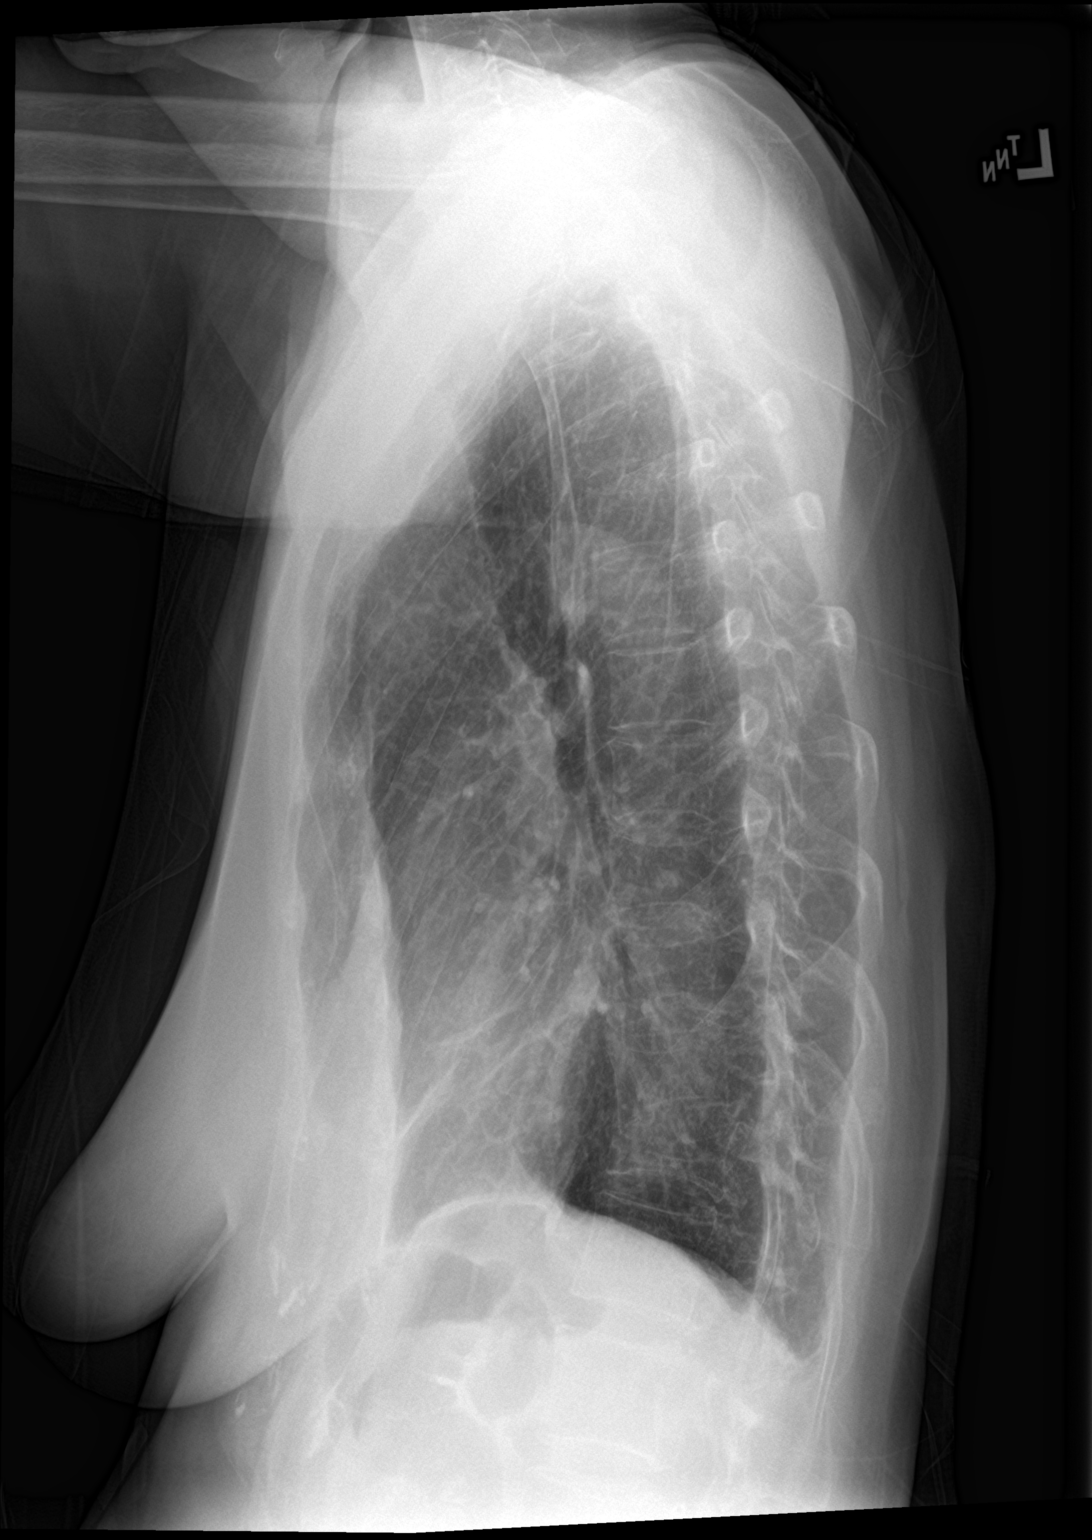

[2 of 2 positions shown; findings below may reference images not displayed]

FINDINGS: The heart size and mediastinal contours are within normal limits.
The thoracic aorta is tortuous in appearance without aneurysm.
Emphysematous hyperinflation of the lungs, upper lobe predominant.
No pulmonary consolidation, CHF, effusion or pneumothorax. Mild
pectus deformity of the anterior chest wall obscuring the right
heart border. Slight stable dextroconvex curvature of the
midthoracic spine.
IMPRESSION: Emphysematous hyperinflation of the lungs. No active pulmonary
disease. Mild pectus deformity of the anterior chest wall.

## 2022-04-15 ENCOUNTER — Other Ambulatory Visit: Payer: Self-pay | Admitting: *Deleted

## 2022-04-15 DIAGNOSIS — Z1231 Encounter for screening mammogram for malignant neoplasm of breast: Secondary | ICD-10-CM
